# Patient Record
Sex: Female | Born: 1965 | Hispanic: Yes | Smoking: Never smoker
Health system: Southern US, Community
[De-identification: ages and names within clinical notes are randomized; demographics above are authoritative.]

## PROBLEM LIST (undated history)

## (undated) DIAGNOSIS — R7989 Other specified abnormal findings of blood chemistry: Secondary | ICD-10-CM

## (undated) HISTORY — DX: Other specified abnormal findings of blood chemistry: R79.89

---

## 2007-11-01 ENCOUNTER — Ambulatory Visit: Payer: Self-pay | Admitting: Family Medicine

## 2011-03-17 ENCOUNTER — Ambulatory Visit: Payer: Self-pay | Admitting: Internal Medicine

## 2011-04-08 ENCOUNTER — Ambulatory Visit: Payer: Self-pay | Admitting: Family Medicine

## 2013-04-11 ENCOUNTER — Ambulatory Visit: Payer: Self-pay

## 2013-04-27 ENCOUNTER — Ambulatory Visit: Payer: Self-pay

## 2013-05-03 ENCOUNTER — Ambulatory Visit: Payer: Self-pay

## 2013-05-04 LAB — PATHOLOGY REPORT

## 2013-05-07 HISTORY — PX: BREAST BIOPSY: SHX20

## 2014-05-23 ENCOUNTER — Ambulatory Visit
Admit: 2014-05-23 | Disposition: A | Discharge status: Home / Self Care | Payer: Self-pay | Attending: Obstetrics and Gynecology | Admitting: Obstetrics and Gynecology

## 2014-12-18 IMAGING — US US BREAST*L* LIMITED INC AXILLA
1 series · 8 of 8 positions shown · non-contrast
Comparison: [DATE] [DATE], [DATE], [DATE] [DATE], [DATE], [DATE] [DATE], [DATE]

CLINICAL DATA: Callback from screening mammogram for possible mass
left breast

EXAM:
DIGITAL DIAGNOSTIC  LEFT MAMMOGRAM
ULTRASOUND LEFT BREAST

[Series 1: us breast*left* limited inc axilla · 0.07mm/px · 8 of 8 slices shown]
[im 1/8]
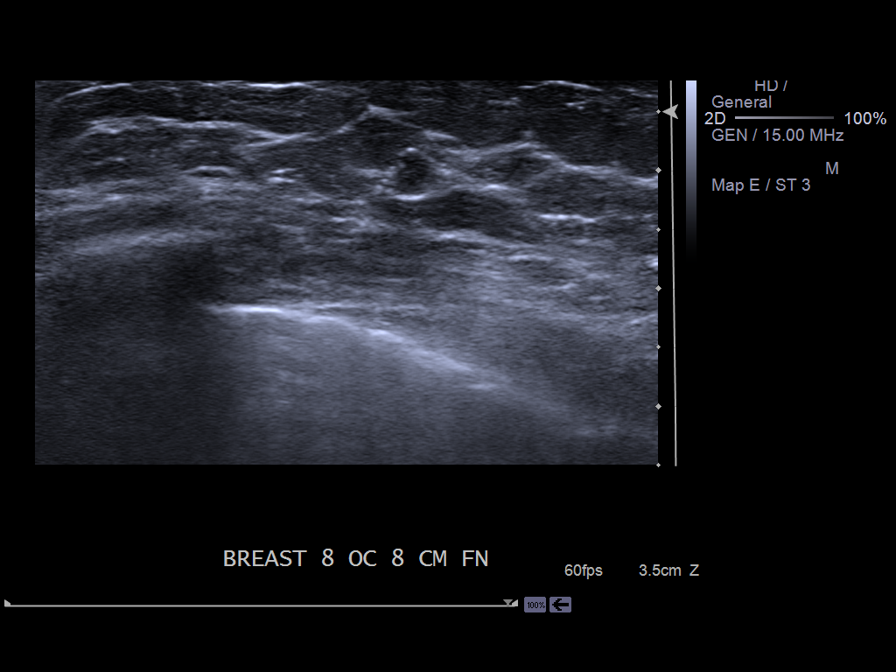
[im 2/8]
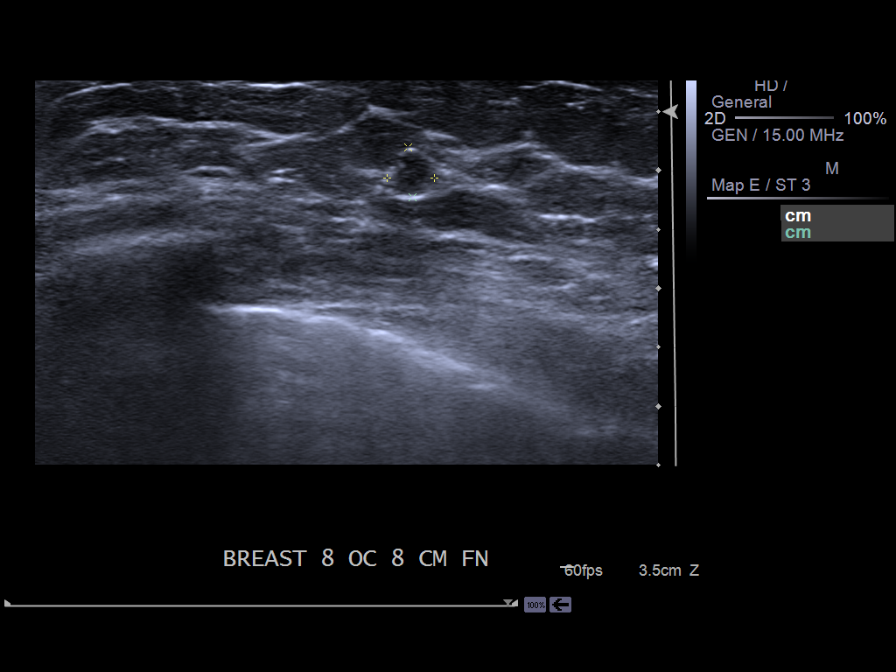
[im 3/8]
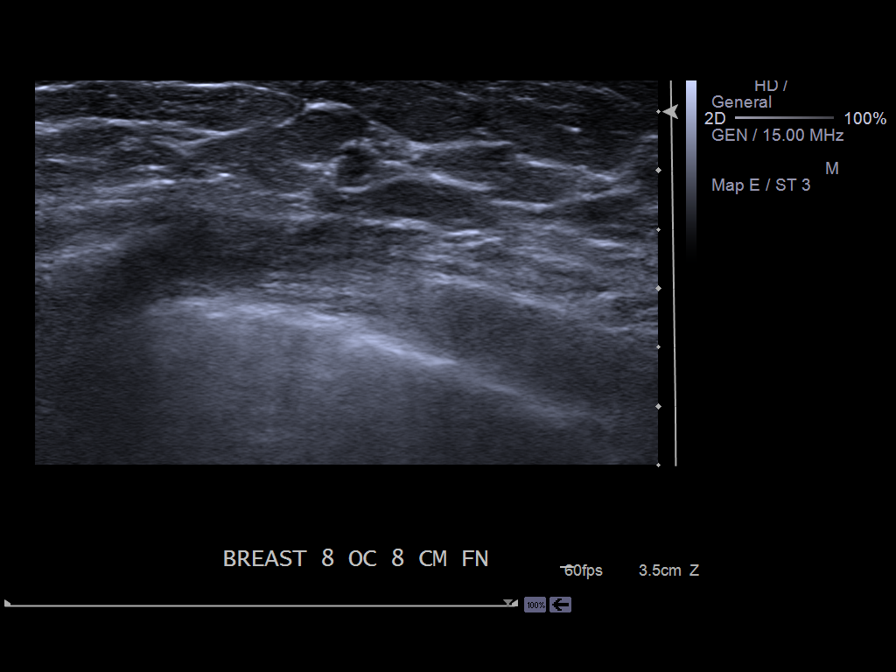
[im 4/8]
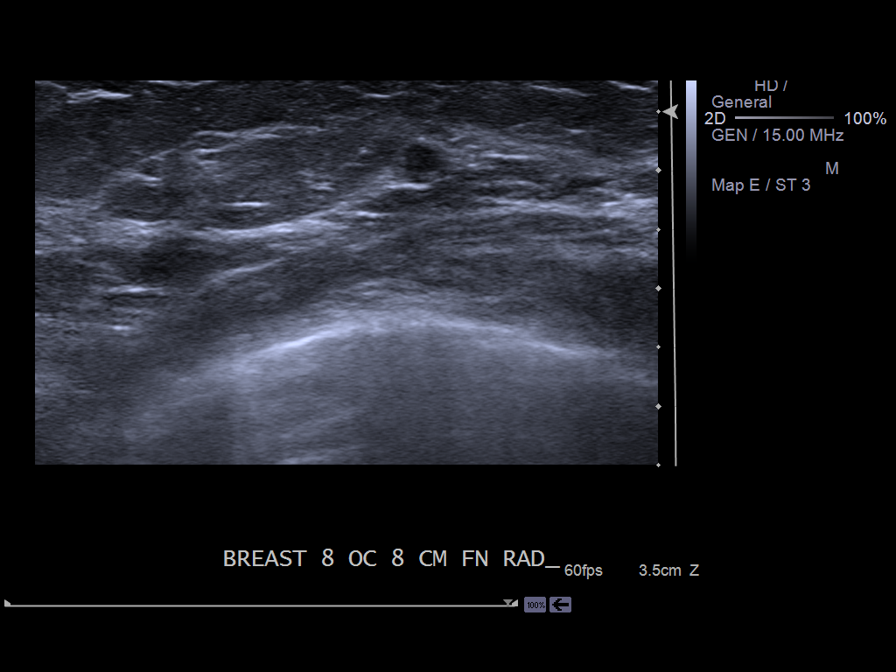
[im 5/8]
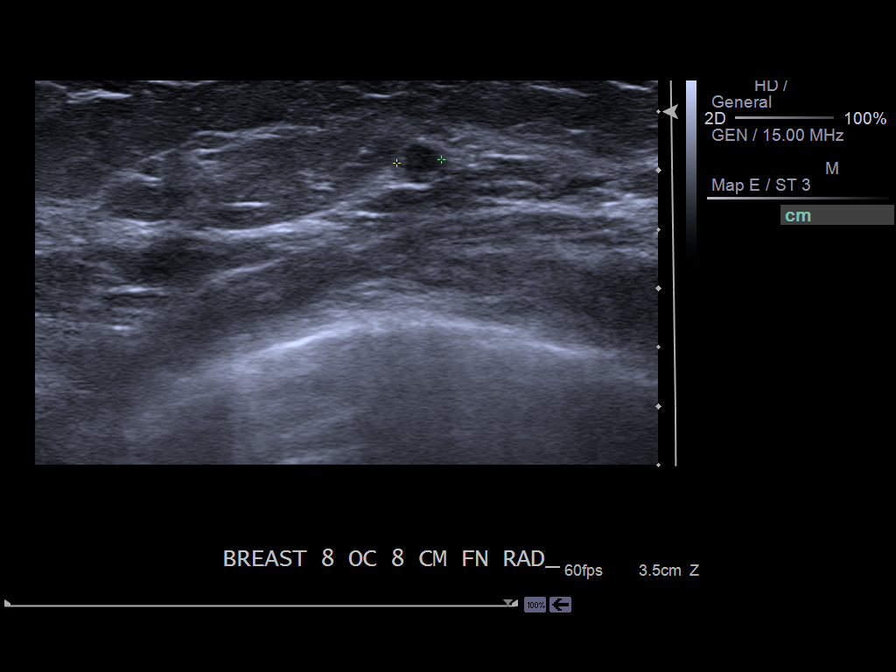
[im 6/8]
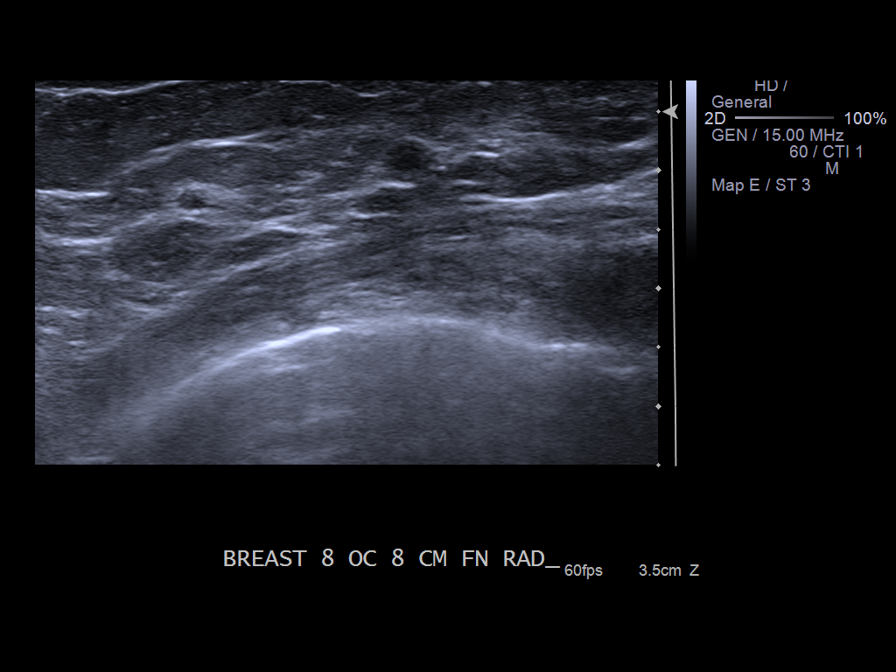
[im 7/8]
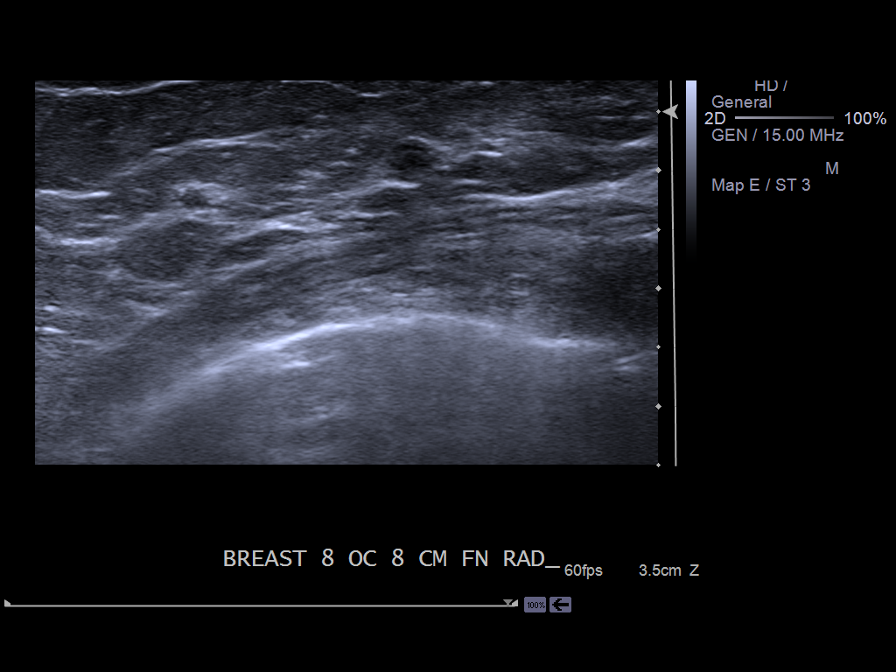
[im 8/8]
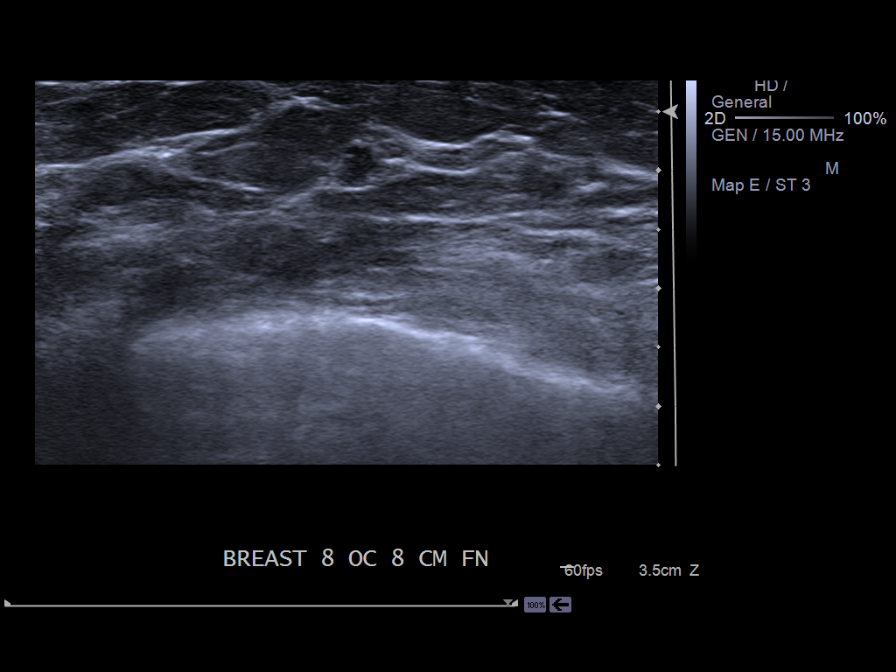

[8 of 8 positions shown; findings below may reference images not displayed]

ACR Breast Density Category c: The breast tissue is heterogeneously
dense, which may obscure small masses.
FINDINGS: Spot compression CC and MLO views of the left breast are submitted.
Previously questioned mass persists on additional views.

Ultrasound is performed, showing slight lobulated border hypoechoic
lesion measuring 0.4 x 0.42 x 0.38 cm at the left breast 8 o'clock 8
cm from nipple correlating to the mammographic finding.
IMPRESSION: Suspicious findings.

RECOMMENDATION:
Ultrasound-guided core biopsy left breast mass.

I have discussed the findings and recommendations with the patient.
Results were also provided in writing at the conclusion of the
visit. If applicable, a reminder letter will be sent to the patient
regarding the next appointment.

BI-RADS CATEGORY  4: Suspicious abnormality - biopsy should be
considered.

## 2014-12-24 IMAGING — MG MM POST US BIOPSY *L*
2 series · 3 of 3 positions shown · non-contrast
Comparison: Previous exams

CLINICAL DATA: Clip placement for left breast mass 8 o'clock
location ultrasound-guided biopsy

EXAM:
DIAGNOSTIC left MAMMOGRAM POST ULTRASOUND BIOPSY

[L CC · left · 2 of 2 slices shown (1 of 2)]
[im 1/2]
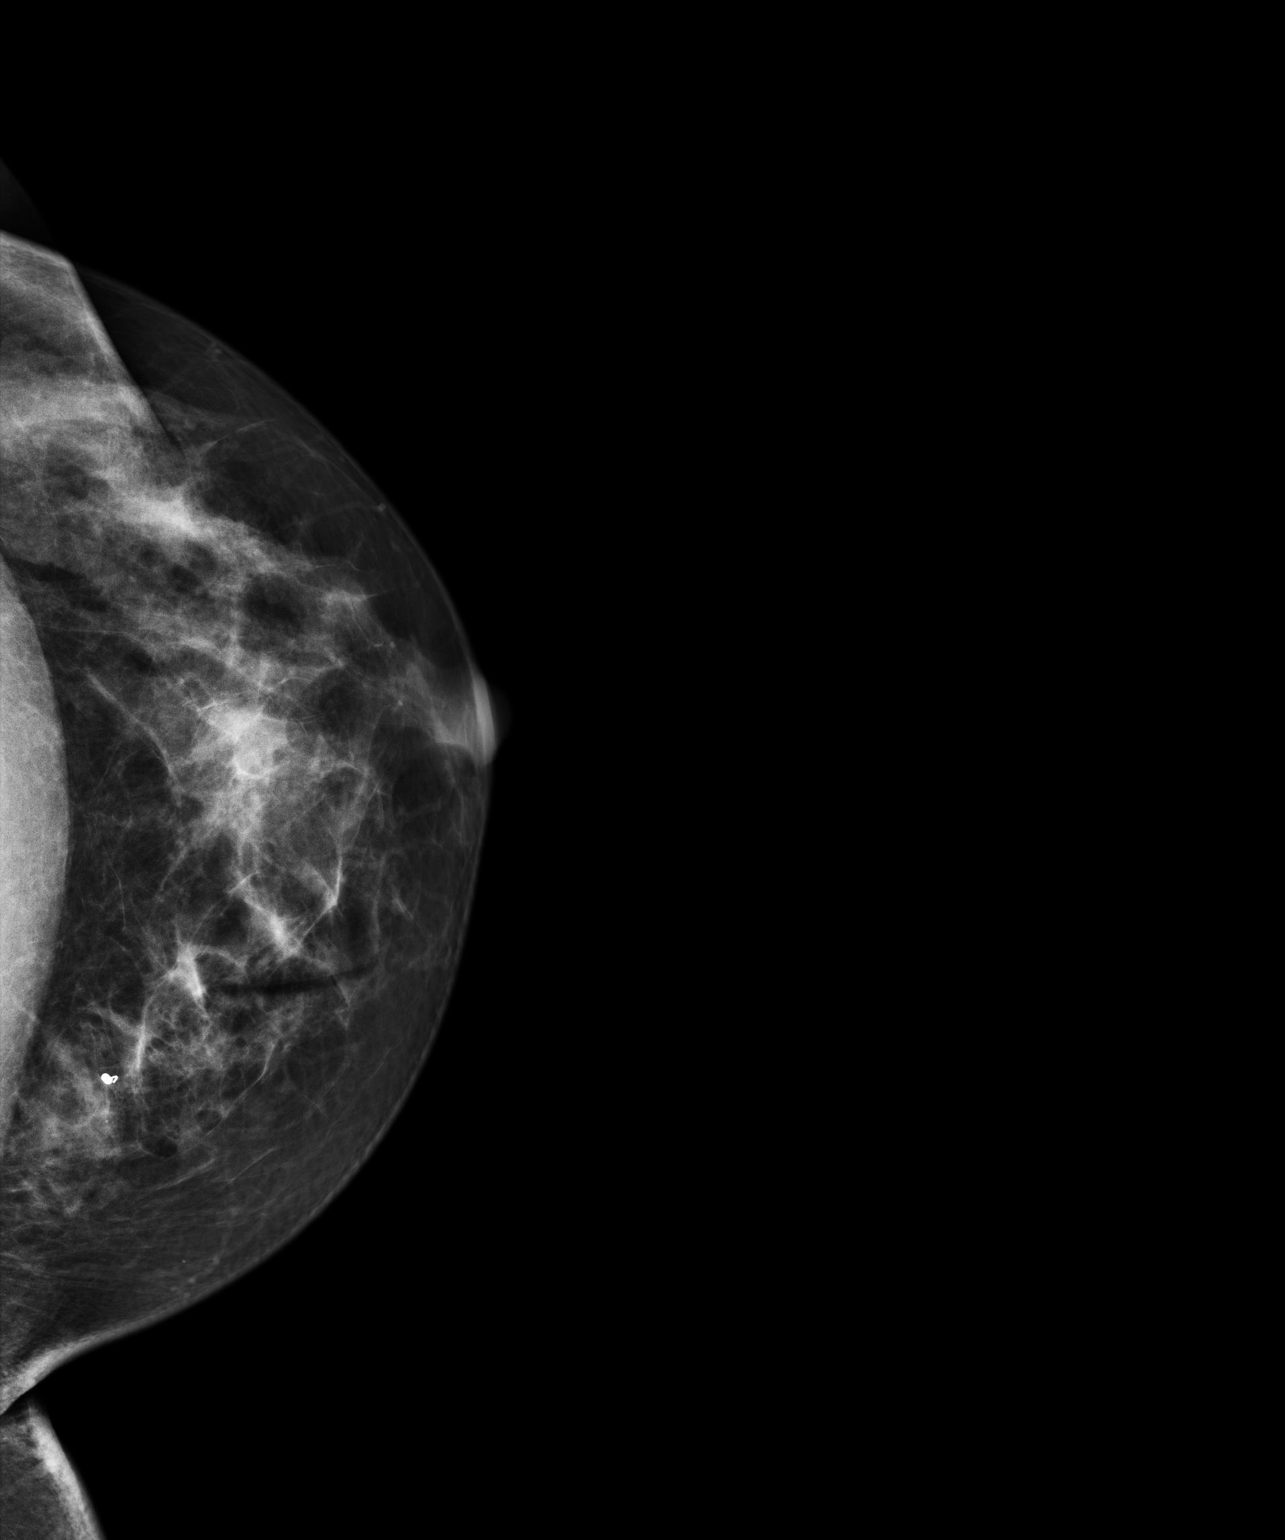
[im 2/2]
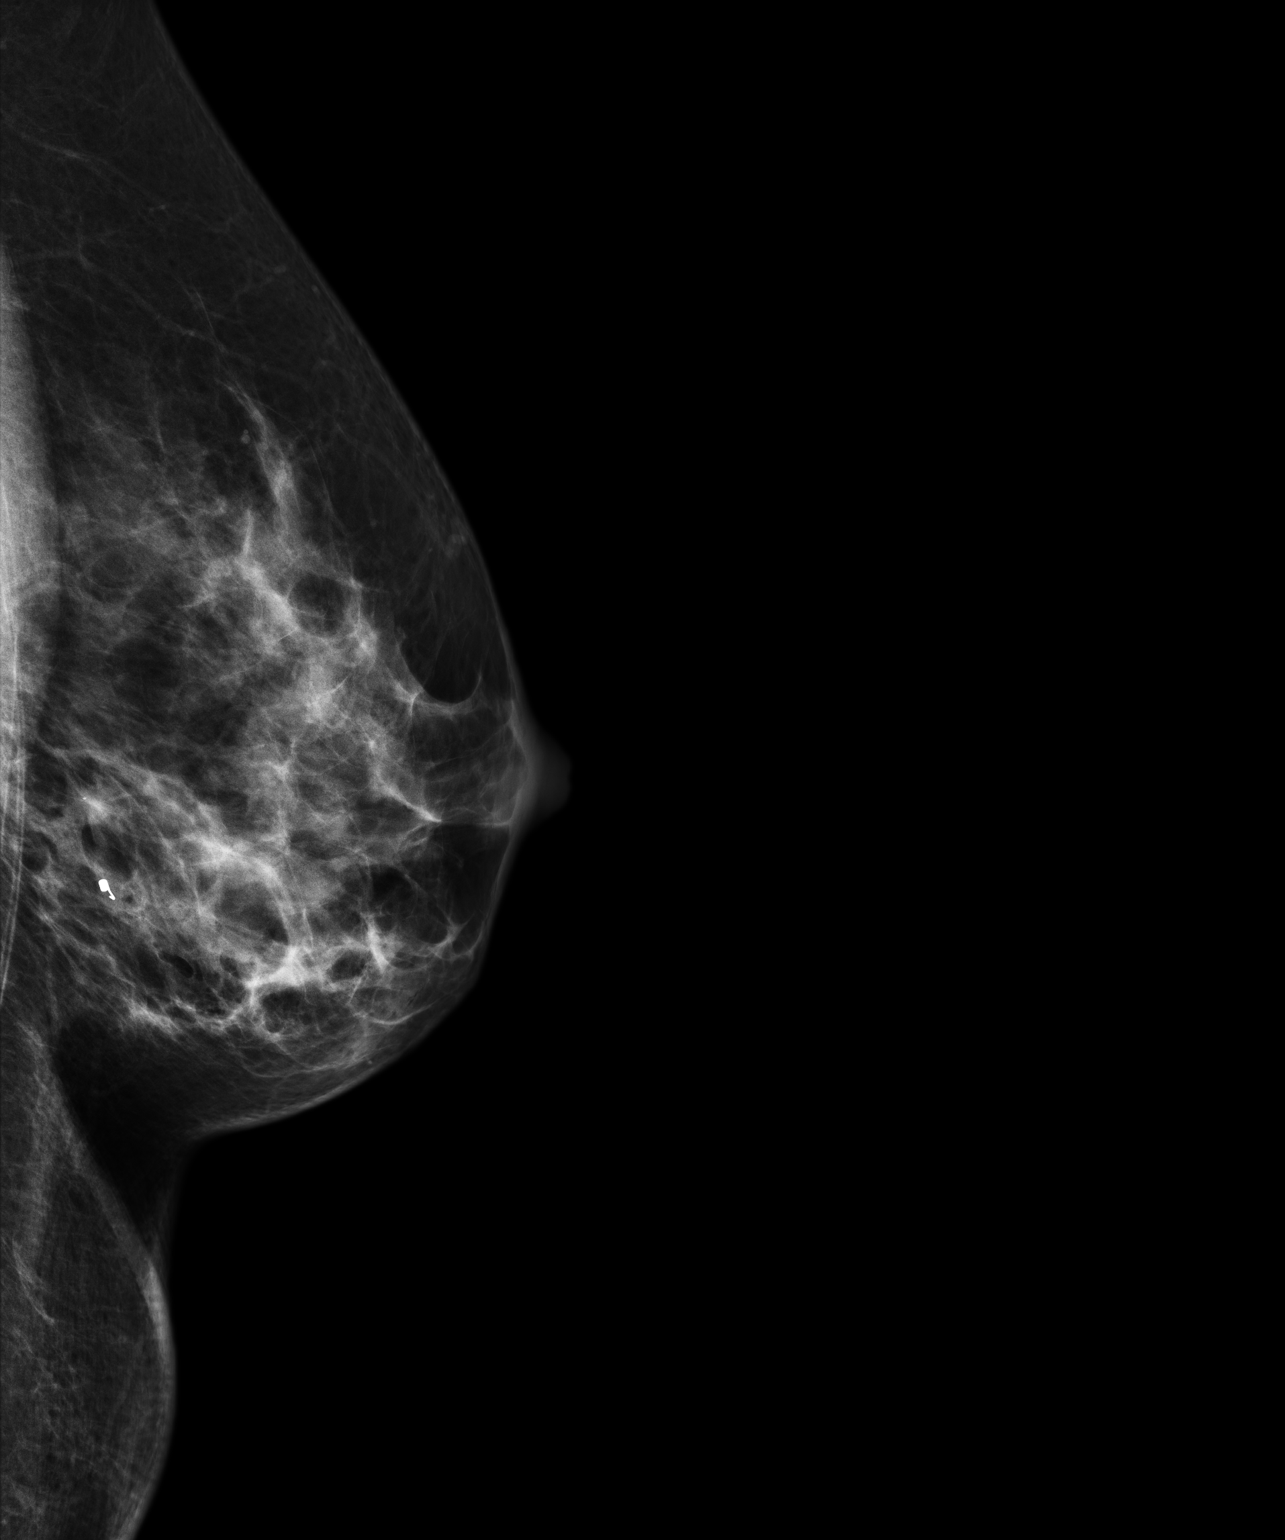

[L CC (2 of 2)]
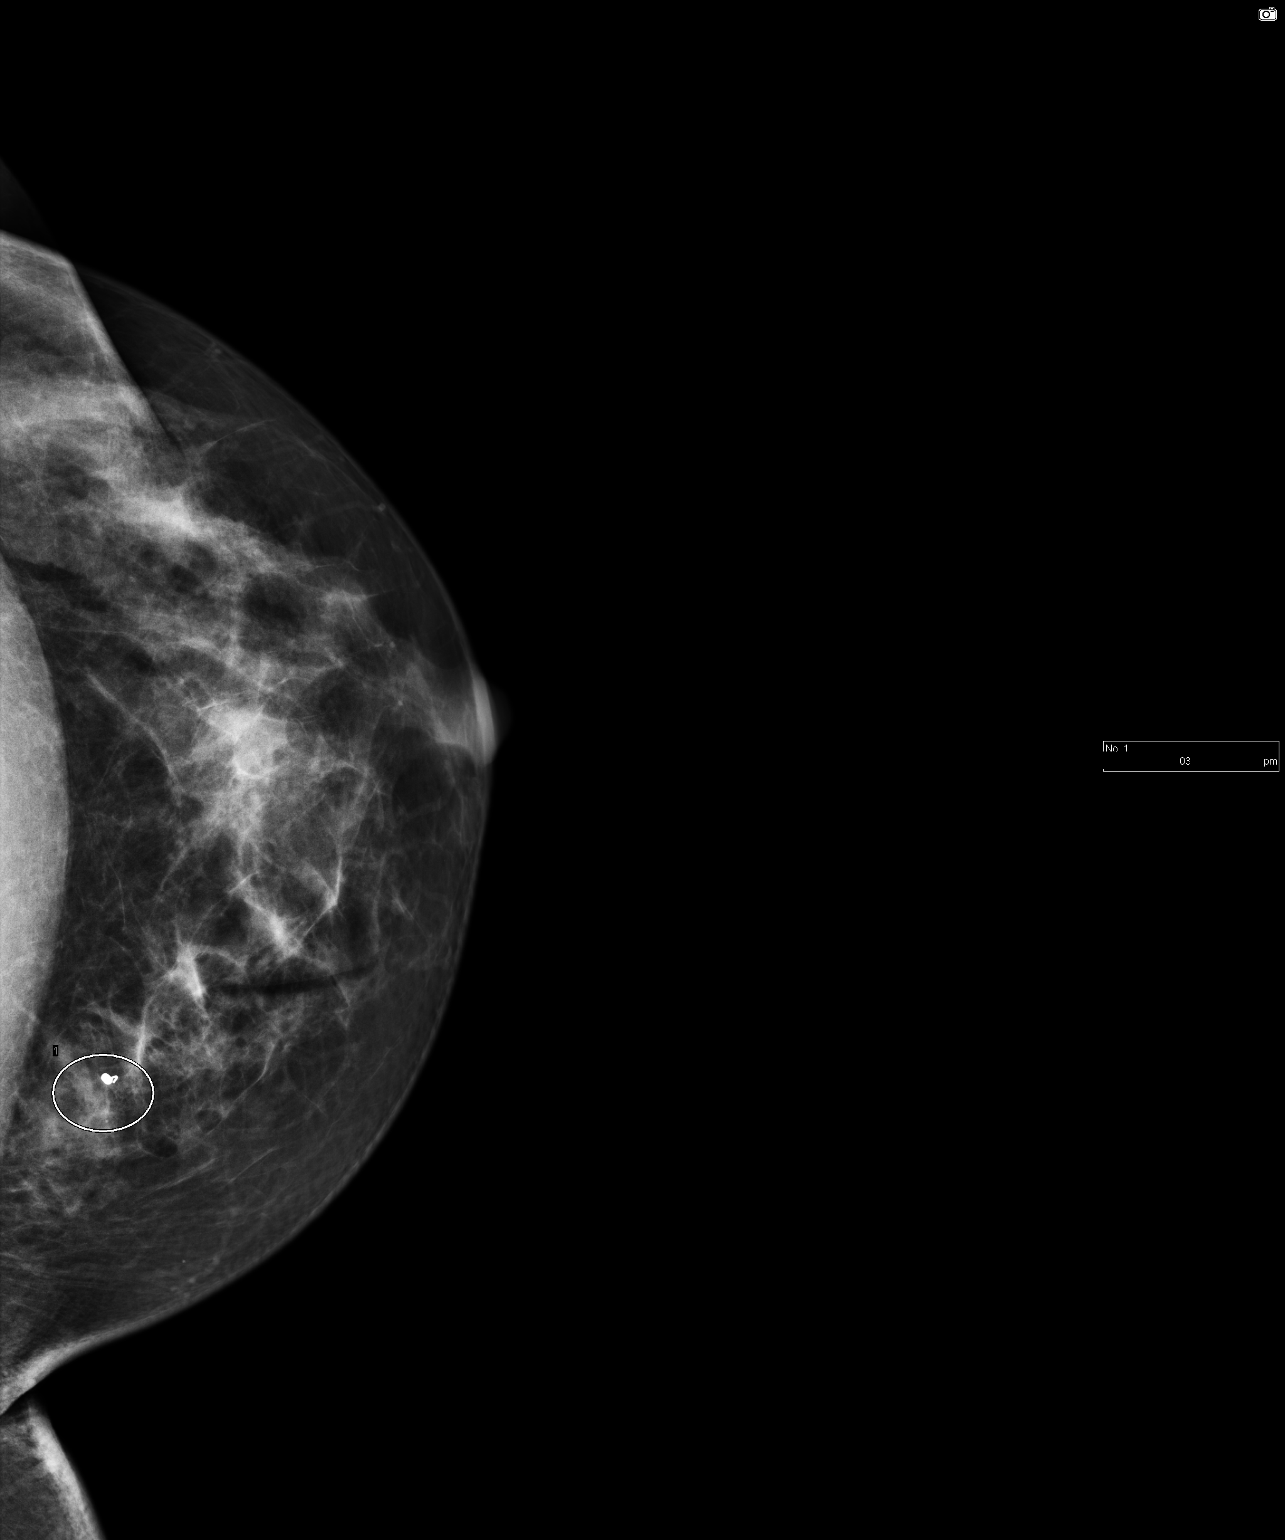

[3 of 3 positions shown; findings below may reference images not displayed]

FINDINGS: Mammographic images were obtained following ultrasound guided biopsy
of left breast mass 8 o'clock location. The coil shaped clip is
appropriately located at the biopsy site..
IMPRESSION: Appropriate coil shaped clip location, left breast 8 o'clock
location.

Final Assessment: Post Procedure Mammograms for Marker Placement

## 2015-04-09 ENCOUNTER — Other Ambulatory Visit: Payer: Self-pay | Admitting: Nurse Practitioner

## 2015-04-09 DIAGNOSIS — Z1231 Encounter for screening mammogram for malignant neoplasm of breast: Secondary | ICD-10-CM

## 2015-05-26 ENCOUNTER — Ambulatory Visit: Payer: Self-pay | Attending: Nurse Practitioner

## 2016-04-13 ENCOUNTER — Other Ambulatory Visit: Payer: Self-pay | Admitting: Nurse Practitioner

## 2016-04-13 DIAGNOSIS — Z1231 Encounter for screening mammogram for malignant neoplasm of breast: Secondary | ICD-10-CM

## 2016-05-10 ENCOUNTER — Encounter: Payer: Self-pay | Admitting: Radiology

## 2016-05-10 ENCOUNTER — Ambulatory Visit
Admission: RE | Admit: 2016-05-10 | Discharge: 2016-05-10 | Disposition: A | Discharge status: Home / Self Care | Payer: Managed Care, Other (non HMO) | Source: Ambulatory Visit | Attending: Nurse Practitioner | Admitting: Nurse Practitioner

## 2016-05-10 DIAGNOSIS — Z1231 Encounter for screening mammogram for malignant neoplasm of breast: Secondary | ICD-10-CM | POA: Diagnosis not present

## 2017-04-15 ENCOUNTER — Other Ambulatory Visit: Payer: Self-pay | Admitting: Nurse Practitioner

## 2017-04-15 ENCOUNTER — Encounter: Payer: Self-pay | Admitting: Nurse Practitioner

## 2017-04-15 ENCOUNTER — Ambulatory Visit: Payer: Managed Care, Other (non HMO) | Admitting: Nurse Practitioner

## 2017-04-15 VITALS — BP 111/72 | HR 64 | Resp 16 | Ht <= 58 in | Wt 131.0 lb

## 2017-04-15 DIAGNOSIS — R300 Dysuria: Secondary | ICD-10-CM

## 2017-04-15 DIAGNOSIS — E559 Vitamin D deficiency, unspecified: Secondary | ICD-10-CM | POA: Diagnosis not present

## 2017-04-15 DIAGNOSIS — Z0001 Encounter for general adult medical examination with abnormal findings: Secondary | ICD-10-CM

## 2017-04-15 DIAGNOSIS — Z1231 Encounter for screening mammogram for malignant neoplasm of breast: Secondary | ICD-10-CM

## 2017-04-15 DIAGNOSIS — Z1239 Encounter for other screening for malignant neoplasm of breast: Secondary | ICD-10-CM

## 2017-04-15 NOTE — Progress Notes (Signed)
[facility] [place], [place] [address]  Internal MEDICINE  Office Visit Note  Patient Name: Kathleen Washington  [id number]  [id number]  Date of Service: 05/07/2017  No chief complaint on file.    The patient is here for health maintenance exam. She has no concerns or complaints to bring out at this time.   Pt is here for routine health maintenance examination  Current Medication: No outpatient encounter medications on file as of 04/15/2017.   No facility-administered encounter medications on file as of 04/15/2017.     Surgical History: Past Surgical History:  Procedure Laterality Date  . BREAST BIOPSY Left 05/07/2013   neg    Medical History: No past medical history on file.  Family History: No family history on file.    Review of Systems  Constitutional: Negative for activity change, chills, fatigue and unexpected weight change.  HENT: Negative for congestion, postnasal drip, rhinorrhea, sneezing, sore throat and voice change.   Eyes: Negative.  Negative for redness.  Respiratory: Negative.  Negative for cough, chest tightness, shortness of breath and wheezing.   Cardiovascular: Negative for chest pain and palpitations.  Gastrointestinal: Negative for abdominal pain, constipation, diarrhea, nausea and vomiting.  Endocrine: Negative for cold intolerance, heat intolerance, polydipsia, polyphagia and polyuria.  Genitourinary: Negative for dysuria, frequency, urgency and vaginal discharge.       She is reporting irregular periods. Has one every few months. Very light with mild cramping.   Musculoskeletal: Negative.  Negative for arthralgias, back pain, joint swelling and neck pain.  Skin: Negative.  Negative for rash.  Allergic/Immunologic: Negative for environmental allergies.  Neurological: Negative for tremors, numbness and headaches.  Hematological: Negative for adenopathy. Does not bruise/bleed easily.  Psychiatric/Behavioral: Negative  for behavioral problems (Depression), sleep disturbance and suicidal ideas. The patient is not nervous/anxious.      Today's Vitals   04/15/17 1519  BP: 111/72  Pulse: 64  Resp: 16  SpO2: 98%  Weight: 131 lb (59.4 kg)  Height: 4\' 9"  (1.448 m)    Physical Exam  Constitutional: She is oriented to person, place, and time. She appears well-developed and well-nourished. No distress.  HENT:  Head: Normocephalic and atraumatic.  Mouth/Throat: Oropharynx is clear and moist. No oropharyngeal exudate.  Eyes: Pupils are equal, round, and reactive to light. EOM are normal.  Neck: Normal range of motion. Neck supple. No JVD present. Carotid bruit is not present. No tracheal deviation present. No thyromegaly present.  Cardiovascular: Normal rate, regular rhythm, normal heart sounds and intact distal pulses. Exam reveals no gallop and no friction rub.  No murmur heard. Pulmonary/Chest: Effort normal and breath sounds normal. No respiratory distress. She has no wheezes. She has no rales. She exhibits no tenderness. Right breast exhibits no inverted nipple, no mass, no nipple discharge, no skin change and no tenderness. Left breast exhibits no inverted nipple, no mass, no nipple discharge, no skin change and no tenderness. Breasts are symmetrical.  Abdominal: Soft. Bowel sounds are normal. There is no tenderness.  Musculoskeletal: Normal range of motion.  Lymphadenopathy:    She has no cervical adenopathy.  Neurological: She is alert and oriented to person, place, and time. No cranial nerve deficit.  Skin: Skin is warm and dry. She is not diaphoretic.  Psychiatric: She has a normal mood and affect. Her behavior is normal. Judgment and thought content normal.  Nursing note and vitals reviewed.    LABS: Recent Results (from the past 2160 hour(s))  UA/M  w/rflx Culture, Routine     Status: None   Collection Time: 04/15/17 12:00 AM  Result Value Ref Range   Specific Gravity, UA 1.025 1.005 - 1.030    pH, UA 6.0 5.0 - 7.5   Color, UA Yellow Yellow   Appearance Ur Clear Clear   Leukocytes, UA Negative Negative   Protein, UA Negative Negative/Trace   Glucose, UA Negative Negative   Ketones, UA Negative Negative   RBC, UA Negative Negative   Bilirubin, UA Negative Negative   Urobilinogen, Ur 0.2 0.2 - 1.0 mg/dL   Nitrite, UA Negative Negative   Microscopic Examination Comment     Comment: Microscopic follows if indicated.   Microscopic Examination See below:     Comment: Microscopic was indicated and was performed.   Urinalysis Reflex Comment     Comment: This specimen will not reflex to a Urine Culture.  Microscopic Examination     Status: None   Collection Time: 04/15/17 12:00 AM  Result Value Ref Range   WBC, UA 0-5 0 - 5 /hpf   RBC, UA 0-2 0 - 2 /hpf   Epithelial Cells (non renal) 0-10 0 - 10 /hpf   Casts None seen None seen /lpf   Mucus, UA Present Not Estab.   Bacteria, UA Few None seen/Few  Comprehensive metabolic panel     Status: Abnormal   Collection Time: 04/21/17  9:36 AM  Result Value Ref Range   Glucose 89 65 - 99 mg/dL   BUN 16 6 - 24 mg/dL   Creatinine, Ser 0.62 0.57 - 1.00 mg/dL   GFR calc non Af Amer 104 >59 mL/min/1.73   GFR calc Af Amer 120 >59 mL/min/1.73   BUN/Creatinine Ratio 26 (H) 9 - 23   Sodium 142 134 - 144 mmol/L   Potassium 4.5 3.5 - 5.2 mmol/L   Chloride 106 96 - 106 mmol/L   CO2 24 20 - 29 mmol/L   Calcium 9.7 8.7 - 10.2 mg/dL   Total Protein 7.0 6.0 - 8.5 g/dL   Albumin 4.4 3.5 - 5.5 g/dL   Globulin, Total 2.6 1.5 - 4.5 g/dL   Albumin/Globulin Ratio 1.7 1.2 - 2.2   Bilirubin Total 0.4 0.0 - 1.2 mg/dL   Alkaline Phosphatase 88 39 - 117 IU/L   AST 32 0 - 40 IU/L   ALT 32 0 - 32 IU/L  CBC     Status: None   Collection Time: 04/21/17  9:36 AM  Result Value Ref Range   WBC 5.2 3.4 - 10.8 x10E3/uL   RBC 4.32 3.77 - 5.28 x10E6/uL   Hemoglobin 13.3 11.1 - 15.9 g/dL   Hematocrit 39.6 34.0 - 46.6 %   MCV 92 79 - 97 fL   MCH 30.8 26.6 -  33.0 pg   MCHC 33.6 31.5 - 35.7 g/dL   RDW 13.2 12.3 - 15.4 %   Platelets 246 150 - 379 x10E3/uL  Lipid Panel w/o Chol/HDL Ratio     Status: Abnormal   Collection Time: 04/21/17  9:36 AM  Result Value Ref Range   Cholesterol, Total 194 100 - 199 mg/dL   Triglycerides 37 0 - 149 mg/dL   HDL 77 >39 mg/dL   VLDL Cholesterol Cal 7 5 - 40 mg/dL   LDL Calculated 110 (H) 0 - 99 mg/dL  T4, free     Status: None   Collection Time: 04/21/17  9:36 AM  Result Value Ref Range   Free T4 1.10 0.82 - 1.77  ng/dL  TSH     Status: None   Collection Time: 04/21/17  9:36 AM  Result Value Ref Range   TSH 1.070 0.450 - 4.500 uIU/mL  VITAMIN D 25 Hydroxy (Vit-D Deficiency, Fractures)     Status: Abnormal   Collection Time: 04/21/17  9:36 AM  Result Value Ref Range   Vit D, 25-Hydroxy 28.0 (L) 30.0 - 100.0 ng/mL    Comment: Vitamin D deficiency has been defined by the [place] and an Endocrine Society practice guideline as a level of serum 25-OH vitamin D less than 20 ng/mL (1,2). The Endocrine Society went on to further define vitamin D insufficiency as a level between 21 and 29 ng/mL (2). 1. IOM (Institute of Medicine). 2010. Dietary reference    intakes for calcium and D. [place]: The    [employer]. 2. Holick MF, Binkley Bel[place], Bischoff-Ferrari HA, et al.    Evaluation, treatment, and prevention of vitamin D    deficiency: an Endocrine Society clinical practice    guideline. JCEM. 2011 Jul; 96(7):1911-30.      Assessment/Plan: 1. Encounter for general adult medical examination with abnormal findings Annual wellness visit today - CBC with Differential/Platelet - Comprehensive metabolic panel - T4, free - TSH - Lipid panel  2. Vitamin D deficiency Check labs with vitamin d level - Vitamin D 1,25 dihydroxy  3. Dysuria - Urinalysis, Routine w reflex microscopic  4. Screening for breast cancer - MM DIGITAL SCREENING BILATERAL; Future  General Counseling:  Jax verbalizes understanding of the findings of todays visit and agrees with plan of treatment. I have discussed any further diagnostic evaluation that may be needed or ordered today. We also reviewed her medications today. she has been encouraged to call the office with any questions or concerns that should arise related to todays visit.  This patient was seen by Leretha Pol, FNP- C in Collaboration with Dr Lavera Guise as a part of collaborative care agreement    Orders Placed This Encounter  Procedures  . MM DIGITAL SCREENING BILATERAL  . Urinalysis, Routine w reflex microscopic  . CBC with Differential/Platelet  . Comprehensive metabolic panel  . T4, free  . TSH  . Lipid panel  . Vitamin D 1,25 dihydroxy      Time spent: [place], MD  Internal Medicine

## 2017-04-16 LAB — UA/M W/RFLX CULTURE, ROUTINE
BILIRUBIN UA: NEGATIVE
Glucose, UA: NEGATIVE
Ketones, UA: NEGATIVE
LEUKOCYTES UA: NEGATIVE
Nitrite, UA: NEGATIVE
PH UA: 6 (ref 5.0–7.5)
PROTEIN UA: NEGATIVE
RBC, UA: NEGATIVE
Specific Gravity, UA: 1.025 (ref 1.005–1.030)
UUROB: 0.2 mg/dL (ref 0.2–1.0)

## 2017-04-16 LAB — MICROSCOPIC EXAMINATION: Casts: NONE SEEN /lpf

## 2017-04-21 ENCOUNTER — Other Ambulatory Visit: Payer: Self-pay | Admitting: Nurse Practitioner

## 2017-04-22 LAB — VITAMIN D 25 HYDROXY (VIT D DEFICIENCY, FRACTURES): Vit D, 25-Hydroxy: 28 ng/mL — ABNORMAL LOW (ref 30.0–100.0)

## 2017-04-22 LAB — LIPID PANEL W/O CHOL/HDL RATIO
Cholesterol, Total: 194 mg/dL (ref 100–199)
HDL: 77 mg/dL (ref >39)
LDL Calculated: 110 mg/dL — ABNORMAL HIGH (ref 0–99)
TRIGLYCERIDES: 37 mg/dL (ref 0–149)
VLDL Cholesterol Cal: 7 mg/dL (ref 5–40)

## 2017-04-22 LAB — T4, FREE: Free T4: 1.1 ng/dL (ref 0.82–1.77)

## 2017-04-22 LAB — CBC
HEMATOCRIT: 39.6 % (ref 34.0–46.6)
Hemoglobin: 13.3 g/dL (ref 11.1–15.9)
MCH: 30.8 pg (ref 26.6–33.0)
MCHC: 33.6 g/dL (ref 31.5–35.7)
MCV: 92 fL (ref 79–97)
PLATELETS: 246 10*3/uL (ref 150–379)
RBC: 4.32 x10E6/uL (ref 3.77–5.28)
RDW: 13.2 % (ref 12.3–15.4)
WBC: 5.2 10*3/uL (ref 3.4–10.8)

## 2017-04-22 LAB — COMPREHENSIVE METABOLIC PANEL
ALBUMIN: 4.4 g/dL (ref 3.5–5.5)
ALK PHOS: 88 IU/L (ref 39–117)
ALT: 32 IU/L (ref 0–32)
AST: 32 IU/L (ref 0–40)
Albumin/Globulin Ratio: 1.7 (ref 1.2–2.2)
BUN / CREAT RATIO: 26 — AB (ref 9–23)
BUN: 16 mg/dL (ref 6–24)
Bilirubin Total: 0.4 mg/dL (ref 0.0–1.2)
CHLORIDE: 106 mmol/L (ref 96–106)
CO2: 24 mmol/L (ref 20–29)
Calcium: 9.7 mg/dL (ref 8.7–10.2)
Creatinine, Ser: 0.62 mg/dL (ref 0.57–1.00)
GFR calc Af Amer: 120 mL/min/{1.73_m2} (ref >59)
GFR calc non Af Amer: 104 mL/min/{1.73_m2} (ref >59)
GLUCOSE: 89 mg/dL (ref 65–99)
Globulin, Total: 2.6 g/dL (ref 1.5–4.5)
Potassium: 4.5 mmol/L (ref 3.5–5.2)
Sodium: 142 mmol/L (ref 134–144)
Total Protein: 7 g/dL (ref 6.0–8.5)

## 2017-04-22 LAB — TSH: TSH: 1.07 u[IU]/mL (ref 0.450–4.500)

## 2017-04-25 ENCOUNTER — Telehealth: Payer: Self-pay

## 2017-04-25 NOTE — Telephone Encounter (Signed)
Completed [facility] request on Dx codes and pt demographics and faxed back.  dbs

## 2017-05-07 DIAGNOSIS — R300 Dysuria: Secondary | ICD-10-CM | POA: Insufficient documentation

## 2017-05-07 DIAGNOSIS — Z1211 Encounter for screening for malignant neoplasm of colon: Secondary | ICD-10-CM | POA: Insufficient documentation

## 2017-05-07 DIAGNOSIS — E559 Vitamin D deficiency, unspecified: Secondary | ICD-10-CM | POA: Insufficient documentation

## 2017-09-05 ENCOUNTER — Ambulatory Visit
Admission: RE | Admit: 2017-09-05 | Discharge: 2017-09-05 | Disposition: A | Discharge status: Home / Self Care | Payer: Managed Care, Other (non HMO) | Source: Ambulatory Visit | Attending: Nurse Practitioner | Admitting: Nurse Practitioner

## 2017-09-05 DIAGNOSIS — Z1239 Encounter for other screening for malignant neoplasm of breast: Secondary | ICD-10-CM

## 2017-09-05 DIAGNOSIS — Z1231 Encounter for screening mammogram for malignant neoplasm of breast: Secondary | ICD-10-CM | POA: Diagnosis present

## 2018-04-17 ENCOUNTER — Ambulatory Visit (INDEPENDENT_AMBULATORY_CARE_PROVIDER_SITE_OTHER): Payer: Managed Care, Other (non HMO) | Admitting: Nurse Practitioner

## 2018-04-17 ENCOUNTER — Encounter: Payer: Self-pay | Admitting: Nurse Practitioner

## 2018-04-17 VITALS — BP 120/80 | HR 75 | Resp 16 | Ht <= 58 in | Wt 134.0 lb

## 2018-04-17 DIAGNOSIS — Z1211 Encounter for screening for malignant neoplasm of colon: Secondary | ICD-10-CM | POA: Diagnosis not present

## 2018-04-17 DIAGNOSIS — Z1239 Encounter for other screening for malignant neoplasm of breast: Secondary | ICD-10-CM | POA: Diagnosis not present

## 2018-04-17 DIAGNOSIS — Z0001 Encounter for general adult medical examination with abnormal findings: Secondary | ICD-10-CM

## 2018-04-17 DIAGNOSIS — R300 Dysuria: Secondary | ICD-10-CM

## 2018-04-17 NOTE — Progress Notes (Signed)
[facility] [place],  [address]  Internal MEDICINE  Office Visit Note  Patient Name: Kathleen Washington  [id number]  [id number]  Date of Service: 04/30/2018   Pt is here for routine health maintenance examination  Chief Complaint  Patient presents with  . Annual Exam  . Quality Metric Gaps    colonoscopy     The patient is here for health maintenance exam. She has no concerns or complaints to bring out at this time. She is due to have routine, fasting labs, mammogram, and screening colonoscopy.     Current Medication: No outpatient encounter medications on file as of 04/17/2018.   No facility-administered encounter medications on file as of 04/17/2018.     Surgical History: Past Surgical History:  Procedure Laterality Date  . BREAST BIOPSY Left 05/07/2013   neg    Medical History: History reviewed. No pertinent past medical history.  Family History: History reviewed. No pertinent family history.    Review of Systems  Constitutional: Negative for activity change, chills, fatigue and unexpected weight change.  HENT: Negative for congestion, postnasal drip, rhinorrhea, sneezing, sore throat and voice change.   Respiratory: Negative for cough, chest tightness, shortness of breath and wheezing.   Cardiovascular: Negative for chest pain and palpitations.  Gastrointestinal: Negative for abdominal pain, constipation, diarrhea, nausea and vomiting.  Endocrine: Negative for cold intolerance, heat intolerance, polydipsia and polyuria.  Genitourinary: Negative for dysuria, frequency, menstrual problem, urgency and vaginal discharge.       She is reporting irregular periods. Has one every few months. Very light with mild cramping.   Musculoskeletal: Negative for arthralgias, back pain, joint swelling and neck pain.  Skin: Negative for rash.  Allergic/Immunologic: Negative for environmental allergies.  Neurological: Negative for tremors,  numbness and headaches.  Hematological: Negative for adenopathy. Does not bruise/bleed easily.  Psychiatric/Behavioral: Negative for behavioral problems (Depression), sleep disturbance and suicidal ideas. The patient is not nervous/anxious.      Today's Vitals   04/17/18 1431  BP: 120/80  Pulse: 75  Resp: 16  SpO2: 98%  Weight: 134 lb (60.8 kg)  Height: 4\' 9"  (1.448 m)   Body mass index is 29 kg/m.  Physical Exam Vitals signs and nursing note reviewed.  Constitutional:      General: She is not in acute distress.    Appearance: Normal appearance. She is well-developed. She is not diaphoretic.  HENT:     Head: Normocephalic and atraumatic.     Mouth/Throat:     Pharynx: No oropharyngeal exudate.  Eyes:     Conjunctiva/sclera: Conjunctivae normal.     Pupils: Pupils are equal, round, and reactive to light.  Neck:     Musculoskeletal: Normal range of motion and neck supple.     Thyroid: No thyromegaly.     Vascular: No carotid bruit or JVD.     Trachea: No tracheal deviation.  Cardiovascular:     Rate and Rhythm: Normal rate and regular rhythm.     Pulses: Normal pulses.     Heart sounds: Normal heart sounds. No murmur. No friction rub. No gallop.   Pulmonary:     Effort: Pulmonary effort is normal. No respiratory distress.     Breath sounds: Normal breath sounds. No wheezing or rales.  Chest:     Chest wall: No tenderness.     Breasts: Breasts are symmetrical.        Right: Normal. No swelling, bleeding, inverted nipple, mass, nipple discharge, skin change  or tenderness.        Left: Normal. No swelling, bleeding, inverted nipple, mass, nipple discharge, skin change or tenderness.  Abdominal:     General: Bowel sounds are normal.     Palpations: Abdomen is soft.     Tenderness: There is no abdominal tenderness.  Musculoskeletal: Normal range of motion.  Lymphadenopathy:     Cervical: No cervical adenopathy.  Skin:    General: Skin is warm and dry.  Neurological:      Mental Status: She is alert and oriented to person, place, and time.     Cranial Nerves: No cranial nerve deficit.  Psychiatric:        Behavior: Behavior normal.        Thought Content: Thought content normal.        Judgment: Judgment normal.      LABS: Recent Results (from the past 2160 hour(s))  UA/M w/rflx Culture, Routine     Status: Abnormal   Collection Time: 04/17/18  2:00 PM  Result Value Ref Range   Specific Gravity, UA      <=1.005 (A) 1.005 - 1.030   pH, UA 7.0 5.0 - 7.5   Color, UA Yellow Yellow   Appearance Ur Clear Clear   Leukocytes, UA Negative Negative   Protein, UA Negative Negative/Trace   Glucose, UA Negative Negative   Ketones, UA Negative Negative   RBC, UA Negative Negative   Bilirubin, UA Negative Negative   Urobilinogen, Ur 0.2 0.2 - 1.0 mg/dL   Nitrite, UA Negative Negative   Microscopic Examination Comment     Comment: Microscopic follows if indicated.   Microscopic Examination See below:     Comment: Microscopic was indicated and was performed.   Urinalysis Reflex Comment     Comment: This specimen will not reflex to a Urine Culture.  Microscopic Examination     Status: None   Collection Time: 04/17/18  2:00 PM  Result Value Ref Range   WBC, UA 0-5 0 - 5 /hpf   RBC, UA None seen 0 - 2 /hpf   Epithelial Cells (non renal) 0-10 0 - 10 /hpf   Casts None seen None seen /lpf   Mucus, UA Present Not Estab.   Bacteria, UA Few None seen/Few   Assessment/Plan:  1. Encounter for general adult medical examination with abnormal findings Annual health maintenance exam today. Routine, fasting labs ordered today.   2. Screening for colon cancer Refer to GI for screening colonoscopy.  - Ambulatory referral to Gastroenterology  3. Screening for breast cancer - MM DIGITAL SCREENING BILATERAL; Future  4. Dysuria - UA/M w/rflx Culture, Routine  General Counseling: Jakaria verbalizes understanding of the findings of todays visit and agrees with  plan of treatment. I have discussed any further diagnostic evaluation that may be needed or ordered today. We also reviewed her medications today. she has been encouraged to call the office with any questions or concerns that should arise related to todays visit.    Counseling:  This patient was seen by Leretha Pol FNP Collaboration with Dr Lavera Guise as a part of collaborative care agreement  Orders Placed This Encounter  Procedures  . Microscopic Examination  . MM DIGITAL SCREENING BILATERAL  . UA/M w/rflx Culture, Routine  . Ambulatory referral to Gastroenterology    Time spent: [place], MD  Internal Medicine

## 2018-04-18 LAB — UA/M W/RFLX CULTURE, ROUTINE
BILIRUBIN UA: NEGATIVE
GLUCOSE, UA: NEGATIVE
KETONES UA: NEGATIVE
Leukocytes, UA: NEGATIVE
Nitrite, UA: NEGATIVE
Protein, UA: NEGATIVE
RBC, UA: NEGATIVE
Specific Gravity, UA: <=1.005 — AB (ref 1.005–1.030)
Urobilinogen, Ur: 0.2 mg/dL (ref 0.2–1.0)
pH, UA: 7 (ref 5.0–7.5)

## 2018-04-18 LAB — MICROSCOPIC EXAMINATION
Casts: NONE SEEN /lpf
RBC MICROSCOPIC, UA: NONE SEEN /HPF (ref 0–2)

## 2018-05-08 ENCOUNTER — Encounter: Payer: Self-pay | Admitting: *Deleted

## 2018-10-24 ENCOUNTER — Other Ambulatory Visit: Payer: Self-pay

## 2018-10-24 ENCOUNTER — Telehealth: Payer: Self-pay

## 2018-10-24 DIAGNOSIS — Z1211 Encounter for screening for malignant neoplasm of colon: Secondary | ICD-10-CM

## 2018-10-24 MED ORDER — NA SULFATE-K SULFATE-MG SULF 17.5-3.13-1.6 GM/177ML PO SOLN: 1.0000 | Freq: Once | ORAL | 0 refills | Status: AC

## 2018-10-24 NOTE — Telephone Encounter (Signed)
Gastroenterology Pre-Procedure Review  Request Date: 11/10/18 Requesting Physician: Dr. Marius Ditch  PATIENT REVIEW QUESTIONS: The patient responded to the following health history questions as indicated:    1. Are you having any GI issues? no 2. Do you have a personal history of Polyps? no 3. Do you have a family history of Colon Cancer or Polyps? no 4. Diabetes Mellitus? no 5. Joint replacements in the past 12 months?no 6. Major health problems in the past 3 months?no 7. Any artificial heart valves, MVP, or defibrillator?no    MEDICATIONS & ALLERGIES:    Patient reports the following regarding taking any anticoagulation/antiplatelet therapy:   Plavix, Coumadin, Eliquis, Xarelto, Lovenox, Pradaxa, Brilinta, or Effient? no Aspirin? no  Patient confirms/reports the following medications:  No current outpatient medications on file.   No current facility-administered medications for this visit.     Patient confirms/reports the following allergies:  No Known Allergies  No orders of the defined types were placed in this encounter.   AUTHORIZATION INFORMATION Primary Insurance: 1D#: Group #:  Secondary Insurance: 1D#: Group #:  SCHEDULE INFORMATION: Date: 11/10/18 Time: Location:[facility]

## 2018-10-30 ENCOUNTER — Telehealth: Payer: Self-pay | Admitting: Gastroenterology

## 2018-10-30 NOTE — Telephone Encounter (Signed)
Pt daughter is calling to cancel pt procedure due to having a death in the family and leaving the country she will call us back to r/s

## 2018-11-10 ENCOUNTER — Encounter: Admission: RE | Payer: Self-pay | Source: Home / Self Care

## 2018-11-10 ENCOUNTER — Ambulatory Visit: Admission: RE | Admit: 2018-11-10 | Payer: 59 | Source: Home / Self Care | Admitting: Gastroenterology

## 2018-11-10 SURGERY — COLONOSCOPY WITH PROPOFOL
Anesthesia: General

## 2018-11-27 ENCOUNTER — Ambulatory Visit
Admission: RE | Admit: 2018-11-27 | Discharge: 2018-11-27 | Disposition: A | Discharge status: Home / Self Care | Payer: 59 | Source: Ambulatory Visit | Attending: Nurse Practitioner | Admitting: Nurse Practitioner

## 2018-11-27 DIAGNOSIS — Z1231 Encounter for screening mammogram for malignant neoplasm of breast: Secondary | ICD-10-CM | POA: Diagnosis not present

## 2018-11-27 DIAGNOSIS — Z1239 Encounter for other screening for malignant neoplasm of breast: Secondary | ICD-10-CM

## 2018-11-28 NOTE — Progress Notes (Signed)
Negative mammogram

## 2018-12-04 ENCOUNTER — Other Ambulatory Visit: Payer: Self-pay

## 2018-12-04 ENCOUNTER — Telehealth: Payer: Self-pay | Admitting: Gastroenterology

## 2018-12-04 DIAGNOSIS — Z1211 Encounter for screening for malignant neoplasm of colon: Secondary | ICD-10-CM

## 2018-12-04 NOTE — Telephone Encounter (Signed)
Please call Rosa to schedule patients appointment for colonoscopy.

## 2018-12-04 NOTE — Telephone Encounter (Signed)
Colonoscopy has been scheduled for Monday 12/11/18 with Dr. Vicente Males.  Informed Lequita in Endo that patient will need an interpreter.  Her insurance card will be updated when her daughter comes by the office today to pick up colonoscopy instructions.  Thanks [employer]

## 2018-12-07 ENCOUNTER — Other Ambulatory Visit
Admission: RE | Admit: 2018-12-07 | Discharge: 2018-12-07 | Disposition: A | Discharge status: Home / Self Care | Payer: 59 | Source: Ambulatory Visit | Attending: Gastroenterology | Admitting: Gastroenterology

## 2018-12-07 DIAGNOSIS — Z01812 Encounter for preprocedural laboratory examination: Secondary | ICD-10-CM | POA: Diagnosis not present

## 2018-12-07 DIAGNOSIS — Z20828 Contact with and (suspected) exposure to other viral communicable diseases: Secondary | ICD-10-CM | POA: Insufficient documentation

## 2018-12-07 LAB — SARS CORONAVIRUS 2 (TAT 6-24 HRS): SARS Coronavirus 2: NEGATIVE

## 2018-12-11 ENCOUNTER — Encounter
Admission: RE | Disposition: A | Discharge status: Home / Self Care | Payer: Self-pay | Source: Home / Self Care | Attending: Gastroenterology

## 2018-12-11 ENCOUNTER — Ambulatory Visit: Payer: 59 | Admitting: Certified Registered Nurse Anesthetist

## 2018-12-11 ENCOUNTER — Ambulatory Visit
Admission: RE | Admit: 2018-12-11 | Discharge: 2018-12-11 | Disposition: A | Discharge status: Home / Self Care | Payer: 59 | Attending: Gastroenterology | Admitting: Gastroenterology

## 2018-12-11 DIAGNOSIS — Z1211 Encounter for screening for malignant neoplasm of colon: Secondary | ICD-10-CM | POA: Insufficient documentation

## 2018-12-11 DIAGNOSIS — D122 Benign neoplasm of ascending colon: Secondary | ICD-10-CM | POA: Diagnosis not present

## 2018-12-11 DIAGNOSIS — K635 Polyp of colon: Secondary | ICD-10-CM

## 2018-12-11 HISTORY — PX: COLONOSCOPY WITH PROPOFOL: SHX5780

## 2018-12-11 LAB — POCT PREGNANCY, URINE: Preg Test, Ur: NEGATIVE

## 2018-12-11 SURGERY — COLONOSCOPY WITH PROPOFOL
Anesthesia: General

## 2018-12-11 MED ORDER — PROPOFOL 500 MG/50ML IV EMUL
INTRAVENOUS | Status: DC | PRN
  Administered 2018-12-11: 160 ug/kg/min via INTRAVENOUS

## 2018-12-11 MED ORDER — SODIUM CHLORIDE 0.9 % IV SOLN
INTRAVENOUS | Status: DC
  Administered 2018-12-11: 09:00:00 1000 mL via INTRAVENOUS

## 2018-12-11 MED ORDER — PROPOFOL 10 MG/ML IV BOLUS
INTRAVENOUS | Status: DC | PRN
  Administered 2018-12-11: 60 mg via INTRAVENOUS

## 2018-12-11 NOTE — Anesthesia Procedure Notes (Signed)
Performed by: Sharlize Hoar, CRNA Pre-anesthesia Checklist: Patient identified, Emergency Drugs available, Patient being monitored, Suction available and Timeout performed Patient Re-evaluated:Patient Re-evaluated prior to induction Oxygen Delivery Method: Nasal cannula Induction Type: IV induction       

## 2018-12-11 NOTE — Anesthesia Post-op Follow-up Note (Signed)
Anesthesia QCDR form completed.        

## 2018-12-11 NOTE — Anesthesia Preprocedure Evaluation (Addendum)
Anesthesia Evaluation  Patient identified by MRN, date of birth, ID band Patient awake    Reviewed: Allergy & Precautions, H&P , NPO status , Patient's Chart, lab work & pertinent test results  Airway Mallampati: II  TM Distance: >3 FB Neck ROM: full    Dental  (+) Upper Dentures, Lower Dentures   Pulmonary neg pulmonary ROS,           Cardiovascular negative cardio ROS       Neuro/Psych negative neurological ROS  negative psych ROS   GI/Hepatic negative GI ROS, Neg liver ROS,   Endo/Other  negative endocrine ROS  Renal/GU negative Renal ROS  negative genitourinary   Musculoskeletal   Abdominal   Peds  Hematology negative hematology ROS (+)   Anesthesia Other Findings No past medical history on file.  Past Surgical History: 05/07/2013: BREAST BIOPSY; Left     Comment:  neg  BMI    Body Mass Index: 31.25 kg/m      Reproductive/Obstetrics negative OB ROS                            Anesthesia Physical Anesthesia Plan  ASA: I  Anesthesia Plan: General   Post-op Pain Management:    Induction:   PONV Risk Score and Plan: Propofol infusion and TIVA  Airway Management Planned: Natural Airway and Nasal Cannula  Additional Equipment:   Intra-op Plan:   Post-operative Plan:   Informed Consent: I have reviewed the patients History and Physical, chart, labs and discussed the procedure including the risks, benefits and alternatives for the proposed anesthesia with the patient or authorized representative who has indicated his/her understanding and acceptance.     Dental Advisory Given  Plan Discussed with: Anesthesiologist, CRNA and Surgeon  Anesthesia Plan Comments:        Anesthesia Quick Evaluation

## 2018-12-11 NOTE — Anesthesia Postprocedure Evaluation (Signed)
Anesthesia Post Note  Patient: Kathleen Washington  Procedure(s) Performed: COLONOSCOPY WITH PROPOFOL (N/A )  Patient location during evaluation: PACU Anesthesia Type: General Level of consciousness: awake and alert Pain management: pain level controlled Vital Signs Assessment: post-procedure vital signs reviewed and stable Respiratory status: spontaneous breathing, nonlabored ventilation and respiratory function stable Cardiovascular status: blood pressure returned to baseline and stable Postop Assessment: no apparent nausea or vomiting Anesthetic complications: no     Last Vitals:  Vitals:   12/11/18 1048 12/11/18 1058  BP: 119/75 106/88  Pulse: 66 65  Resp: 15 13  Temp:    SpO2: 100% 100%    Last Pain:  Vitals:   12/11/18 1058  TempSrc:   PainSc: 0-No pain                 Durenda Hurt

## 2018-12-11 NOTE — Op Note (Signed)
[facility] Gastroenterology Patient Name: Kathleen Washington Procedure Date: 12/11/2018 10:05 AM MRN: [id number] Account #: [id number] Date of Birth: 1966/02/08 Admit Type: Outpatient Age: 53 Room: [facility] ENDO ROOM 4 Gender: Female Note Status: Finalized Procedure:            Colonoscopy Indications:          Screening for colorectal malignant neoplasm Providers:            Jonathon Bellows MD, MD Referring MD:         No Local Md, MD (Referring MD) Medicines:            Monitored Anesthesia Care Complications:        No immediate complications. Procedure:            Pre-Anesthesia Assessment:                       - Prior to the procedure, a History and Physical was                        performed, and patient medications, allergies and                        sensitivities were reviewed. The patient's tolerance of                        previous anesthesia was reviewed.                       - The risks and benefits of the procedure and the                        sedation options and risks were discussed with the                        patient. All questions were answered and informed                        consent was obtained.                       - ASA Grade Assessment: II - A patient with mild                        systemic disease.                       After obtaining informed consent, the colonoscope was                        passed under direct vision. Throughout the procedure,                        the patient's blood pressure, pulse, and oxygen                        saturations were monitored continuously. The                        Colonoscope was introduced through the anus and  advanced to the the cecum, identified by the                        appendiceal orifice. The colonoscopy was performed with                        ease. The patient tolerated the procedure well. The                        quality of the bowel  preparation was excellent. Findings:      The perianal and digital rectal examinations were normal.      A 5 mm polyp was found in the ascending colon. The polyp was sessile.       The polyp was removed with a cold snare. Resection and retrieval were       complete.      The exam was otherwise without abnormality on direct and retroflexion       views. Impression:           - One 5 mm polyp in the ascending colon, removed with a                        cold snare. Resected and retrieved.                       - The examination was otherwise normal on direct and                        retroflexion views. Recommendation:       - Discharge patient to home (with escort).                       - Resume previous diet.                       - Await pathology results.                       - Repeat colonoscopy for surveillance based on                        pathology results. Procedure Code(s):    --- Professional ---                       [id number], Colonoscopy, flexible; with removal of tumor(s),                        polyp(s), or other lesion(s) by snare technique Diagnosis Code(s):    --- Professional ---                       Z12.11, Encounter for screening for malignant neoplasm                        of colon                       K63.5, Polyp of colon CPT copyright 2019 American Medical Association. All rights reserved. The codes documented in this report are preliminary and upon coder review may  be revised to meet current compliance requirements. Jonathon Bellows, MD Jonathon Bellows MD, MD 12/11/2018  10:25:29 AM This report has been signed electronically. Number of Addenda: 0 Note Initiated On: 12/11/2018 10:05 AM Scope Withdrawal Time: 0 hours 11 minutes 22 seconds  Total Procedure Duration: 0 hours 14 minutes 5 seconds  Estimated Blood Loss: Estimated blood loss: none.      [facility]

## 2018-12-11 NOTE — H&P (Signed)
     Jonathon Bellows, MD [address], [place], [place], [place], [address] [address], [place], [place], [place], [address] Phone: [phone]  Fax: [phone]  Primary Care Physician:  Ronnell Freshwater, NP   Pre-Procedure History & Physical: HPI:  Kathleen Washington is a 53 y.o. female is here for an colonoscopy.   No past medical history on file.  Past Surgical History:  Procedure Laterality Date  . BREAST BIOPSY Left 05/07/2013   neg    Prior to Admission medications   Not on File    Allergies as of 12/04/2018  . (No Known Allergies)    No family history on file.  Social History   Socioeconomic History  . Marital status: Divorced    Spouse name: Not on file  . Number of children: Not on file  . Years of education: Not on file  . Highest education level: Not on file  Occupational History  . Not on file  Social Needs  . Financial resource strain: Not on file  . Food insecurity    Worry: Not on file    Inability: Not on file  . Transportation needs    Medical: Not on file    Non-medical: Not on file  Tobacco Use  . Smoking status: Never Smoker  . Smokeless tobacco: Never Used  Substance and Sexual Activity  . Alcohol use: No    Frequency: Never  . Drug use: No  . Sexual activity: Not on file  Lifestyle  . Physical activity    Days per week: Not on file    Minutes per session: Not on file  . Stress: Not on file  Relationships  . Social Herbalist on phone: Not on file    Gets together: Not on file    Attends religious service: Not on file    Active member of club or organization: Not on file    Attends meetings of clubs or organizations: Not on file    Relationship status: Not on file  . Intimate partner violence    Fear of current or ex partner: Not on file    Emotionally abused: Not on file    Physically abused: Not on file    Forced sexual activity: Not on file  Other Topics Concern  . Not on file  Social History  Narrative  . Not on file    Review of Systems: See HPI, otherwise negative ROS  Physical Exam: BP 133/86   Pulse 82   Temp 98.2 F (36.8 C) (Tympanic)   Resp 20   Ht 5' (1.524 m)   Wt 72.6 kg   SpO2 100%   BMI 31.25 kg/m  General:   Alert,  pleasant and cooperative in NAD Head:  Normocephalic and atraumatic. Neck:  Supple; no masses or thyromegaly. Lungs:  Clear throughout to auscultation, normal respiratory effort.    Heart:  +S1, +S2, Regular rate and rhythm, No edema. Abdomen:  Soft, nontender and nondistended. Normal bowel sounds, without guarding, and without rebound.   Neurologic:  Alert and  oriented x4;  grossly normal neurologically.  Impression/Plan: Kathleen Washington is here for an colonoscopy to be performed for Screening colonoscopy average risk   Risks, benefits, limitations, and alternatives regarding  colonoscopy have been reviewed with the patient via spanish interpretor.  Questions have been answered.  All parties agreeable.   Jonathon Bellows, MD  12/11/2018, 10:00 AM

## 2018-12-11 NOTE — Transfer of Care (Signed)
Immediate Anesthesia Transfer of Care Note  Patient: Kathleen Washington  Procedure(s) Performed: COLONOSCOPY WITH PROPOFOL (N/A )  Patient Location: PACU  Anesthesia Type:General  Level of Consciousness: drowsy  Airway & Oxygen Therapy: Patient Spontanous Breathing  Post-op Assessment: Report given to RN and Post -op Vital signs reviewed and stable  Post vital signs: Reviewed and stable  Last Vitals:  Vitals Value Taken Time  BP 92/69   Temp    Pulse    Resp 16   SpO2 100     Last Pain:  Vitals:   12/11/18 0906  TempSrc: Tympanic  PainSc: 0-No pain         Complications: No apparent anesthesia complications

## 2018-12-12 ENCOUNTER — Encounter: Payer: Self-pay | Admitting: Gastroenterology

## 2018-12-12 LAB — SURGICAL PATHOLOGY

## 2018-12-19 ENCOUNTER — Encounter: Payer: Self-pay | Admitting: Gastroenterology

## 2019-04-20 ENCOUNTER — Telehealth: Payer: Self-pay

## 2019-04-20 NOTE — Telephone Encounter (Signed)
LMOM FOR PATIENT TO CONFIRM AND SCREEN FOR 04-24-19 OV.

## 2019-04-24 ENCOUNTER — Encounter: Payer: Self-pay | Admitting: Nurse Practitioner

## 2019-04-24 ENCOUNTER — Ambulatory Visit (INDEPENDENT_AMBULATORY_CARE_PROVIDER_SITE_OTHER): Payer: 59 | Admitting: Nurse Practitioner

## 2019-04-24 ENCOUNTER — Other Ambulatory Visit: Payer: Self-pay

## 2019-04-24 VITALS — BP 116/68 | HR 67 | Temp 97.6°F | Resp 16 | Ht 59.0 in | Wt 133.6 lb

## 2019-04-24 DIAGNOSIS — Z0001 Encounter for general adult medical examination with abnormal findings: Secondary | ICD-10-CM | POA: Diagnosis not present

## 2019-04-24 DIAGNOSIS — M85672 Other cyst of bone, left ankle and foot: Secondary | ICD-10-CM

## 2019-04-24 DIAGNOSIS — M25561 Pain in right knee: Secondary | ICD-10-CM

## 2019-04-24 DIAGNOSIS — G8929 Other chronic pain: Secondary | ICD-10-CM

## 2019-04-24 DIAGNOSIS — M25562 Pain in left knee: Secondary | ICD-10-CM

## 2019-04-24 DIAGNOSIS — M25511 Pain in right shoulder: Secondary | ICD-10-CM

## 2019-04-24 DIAGNOSIS — Z124 Encounter for screening for malignant neoplasm of cervix: Secondary | ICD-10-CM

## 2019-04-24 DIAGNOSIS — R300 Dysuria: Secondary | ICD-10-CM | POA: Diagnosis not present

## 2019-04-24 MED ORDER — IBUPROFEN 600 MG PO TABS: 600.0000 mg | ORAL_TABLET | Freq: Three times a day (TID) | ORAL | 0 refills | Status: AC | PRN

## 2019-04-24 NOTE — Progress Notes (Signed)
[facility] [place], [place] [address]  Internal MEDICINE  Office Visit Note  Patient Name: Kathleen Washington  [id number]  [id number]  Date of Service: 05/02/2019  Chief Complaint  Patient presents with  . Annual Exam  . Gynecologic Exam  . Arm Pain    right arm is hard to raise   . Knee Pain    knees get swollen and is painful  . Foot Pain    on top of the left foot there is a spot that is like a boil and is hard      The patient is here for health maintenance exam and pap smear.  -Pain in right shoulder. Getting worse over past few months. Limiting ROM and strength -pain in both knees. Feel swollen but are not. Hurt with bending and extending the knees. Hurt more when going up and down stairs.  Bony knot on top of left foot. Not tender or painful. Getting larger with time. -family history of rheumatoid arthritis.  -mammogram 11/2018 -due for routine, fasting labs .  Pt is here for routine health maintenance examination  Current Medication: Outpatient Encounter Medications as of 04/24/2019  Medication Sig  . ibuprofen (ADVIL) 600 MG tablet Take 1 tablet (600 mg total) by mouth every 8 (eight) hours as needed.   No facility-administered encounter medications on file as of 04/24/2019.    Surgical History: Past Surgical History:  Procedure Laterality Date  . BREAST BIOPSY Left 05/07/2013   neg  . COLONOSCOPY WITH PROPOFOL N/A 12/11/2018   Procedure: COLONOSCOPY WITH PROPOFOL;  Surgeon: Jonathon Bellows, MD;  Location: [facility] ENDOSCOPY;  Service: Gastroenterology;  Laterality: N/A;    Medical History: History reviewed. No pertinent past medical history.  Family History: History reviewed. No pertinent family history.    Review of Systems  Constitutional: Negative for activity change, chills, fatigue and unexpected weight change.  HENT: Negative for congestion, postnasal drip, rhinorrhea, sneezing and sore throat.   Respiratory: Negative  for cough, chest tightness and shortness of breath.   Cardiovascular: Negative for chest pain and palpitations.  Gastrointestinal: Negative for abdominal pain, constipation, diarrhea, nausea and vomiting.  Endocrine: Negative for cold intolerance, heat intolerance, polydipsia and polyuria.  Genitourinary: Negative for dysuria, frequency, hematuria and urgency.  Musculoskeletal: Positive for arthralgias. Negative for back pain, joint swelling and neck pain.       Right shoulder pain which is gradually getting worse over past few months. Starting to limit range of motion and strength.  Bilateral knee pain. Only sometimes, specifically going up and down stairs.  Cyst like lesion on top of the left foot.   Skin: Negative for rash.  Allergic/Immunologic: Negative for environmental allergies.  Neurological: Negative for dizziness, tremors, numbness and headaches.  Hematological: Negative for adenopathy. Does not bruise/bleed easily.  Psychiatric/Behavioral: Negative for behavioral problems (Depression), sleep disturbance and suicidal ideas. The patient is not nervous/anxious.      Today's Vitals   04/24/19 1418  BP: 116/68  Pulse: 67  Resp: 16  Temp: 97.6 F (36.4 C)  SpO2: 100%  Weight: 133 lb 9.6 oz (60.6 kg)  Height: 4\' 11"  (1.499 m)   Body mass index is 26.98 kg/m.   Physical Exam Vitals and nursing note reviewed.  Constitutional:      General: She is not in acute distress.    Appearance: Normal appearance. She is well-developed. She is not diaphoretic.  HENT:     Head: Normocephalic and atraumatic.  Nose: Nose normal.     Mouth/Throat:     Pharynx: No oropharyngeal exudate.  Eyes:     Pupils: Pupils are equal, round, and reactive to light.  Neck:     Thyroid: No thyromegaly.     Vascular: No carotid bruit or JVD.     Trachea: No tracheal deviation.  Cardiovascular:     Rate and Rhythm: Normal rate and regular rhythm.     Pulses: Normal pulses.     Heart sounds:  Normal heart sounds. No murmur. No friction rub. No gallop.   Pulmonary:     Effort: Pulmonary effort is normal. No respiratory distress.     Breath sounds: Normal breath sounds. No wheezing or rales.  Chest:     Chest wall: No tenderness.     Breasts:        Right: Normal. No swelling, bleeding, inverted nipple, mass, nipple discharge, skin change or tenderness.        Left: Normal. No swelling, bleeding, inverted nipple, mass, nipple discharge, skin change or tenderness.  Abdominal:     General: Bowel sounds are normal.     Palpations: Abdomen is soft.     Tenderness: There is no abdominal tenderness.  Genitourinary:    General: Normal vulva.     Exam position: Supine.     Labia:        Right: No tenderness or lesion.        Left: No tenderness or lesion.      Vagina: Normal. No vaginal discharge, erythema, tenderness or bleeding.     Cervix: No discharge, friability or erythema.     Uterus: Normal.      Adnexa: Right adnexa normal and left adnexa normal.     Comments: No tenderness, masses, or organomeglay present during bimanual exam . Musculoskeletal:        General: Normal range of motion.       Arms:     Cervical back: Normal range of motion and neck supple.       Legs:  Lymphadenopathy:     Cervical: No cervical adenopathy.     Upper Body:     Right upper body: No axillary adenopathy.     Left upper body: No axillary adenopathy.     Lower Body: No right inguinal adenopathy. No left inguinal adenopathy.  Skin:    General: Skin is warm and dry.  Neurological:     Mental Status: She is alert and oriented to person, place, and time.     Cranial Nerves: No cranial nerve deficit.  Psychiatric:        Behavior: Behavior normal.        Thought Content: Thought content normal.        Judgment: Judgment normal.    LABS: Recent Results (from the past 2160 hour(s))  IGP, Aptima HPV     Status: None   Collection Time: 04/24/19  3:00 AM  Result Value Ref Range    Interpretation NILM,QC     Comment: NEGATIVE FOR INTRAEPITHELIAL LESION OR MALIGNANCY. THIS SPECIMEN WAS RESCREENED AS PART OF OUR QUALITY CONTROL PROGRAM.    Category NIL     Comment: Negative for Intraepithelial Lesion   Adequacy SECNI,AOCX     Comment: Satisfactory for evaluation. No endocervical component is identified. The absence of an endocervical component was confirmed by an additional screening evaluation.    Clinician Provided ICD10 Comment     Comment: Z12.4   Performed by: Comment  Comment: Molli Barrows, Cytotechnologist (ASCP)   QC reviewed by: Comment     Comment: Kathrynn Running, Cytotechnologist (ASCP)   Note: Comment     Comment: The Pap smear is a screening test designed to aid in the detection of premalignant and malignant conditions of the uterine cervix.  It is not a diagnostic procedure and should not be used as the sole means of detecting cervical cancer.  Both false-positive and false-negative reports do occur.    Test Methodology Comment     Comment: This liquid based ThinPrep(R) pap test was screened with the use of an image guided system.    HPV Aptima Negative Negative    Comment: This nucleic acid amplification test detects fourteen high-risk HPV types (16,18,31,33,35,39,45,51,52,56,58,59,66,68) without differentiation.   UA/M w/rflx Culture, Routine     Status: None   Collection Time: 04/24/19  3:02 PM   Specimen: Urine   URINE  Result Value Ref Range   Specific Gravity, UA 1.023 1.005 - 1.030   pH, UA 6.0 5.0 - 7.5   Color, UA Yellow Yellow   Appearance Ur Clear Clear   Leukocytes,UA Negative Negative   Protein,UA Negative Negative/Trace   Glucose, UA Negative Negative   Ketones, UA Negative Negative   RBC, UA Negative Negative   Bilirubin, UA Negative Negative   Urobilinogen, Ur 0.2 0.2 - 1.0 mg/dL   Nitrite, UA Negative Negative   Microscopic Examination Comment     Comment: Microscopic follows if indicated.    Microscopic Examination See below:     Comment: Microscopic was indicated and was performed.   Urinalysis Reflex Comment     Comment: This specimen has reflexed to a Urine Culture.  Microscopic Examination     Status: Abnormal   Collection Time: 04/24/19  3:02 PM   URINE  Result Value Ref Range   WBC, UA 6-10 (A) 0 - 5 /hpf   RBC 0-2 0 - 2 /hpf   Epithelial Cells (non renal) 0-10 0 - 10 /hpf   Casts None seen None seen /lpf   Bacteria, UA None seen None seen/Few  Urine Culture, Reflex     Status: Abnormal   Collection Time: 04/24/19  3:02 PM   URINE  Result Value Ref Range   Urine Culture, Routine CANCELED (A)     Comment: Test not performed. Specimen not received at refrigerated temperature.  Result canceled by the ancillary.    Assessment/Plan:  1. Encounter for general adult medical examination with abnormal findings Annual health maintenance exam with pap smear today.   2. Acute pain of right shoulder Add ibuprofen 600mg  up to three times daily as needed for pain. Will get x-ray of the shoulder for further evaluation. Refer to orthopedics as indicated.  - ibuprofen (ADVIL) 600 MG tablet; Take 1 tablet (600 mg total) by mouth every 8 (eight) hours as needed.  Dispense: 30 tablet; Refill: 0 - DG Shoulder Right; Future  3. Chronic pain of both knees Likely related to osteoarthritis. Advised she take NSAIDs as needed and as prescribed. Will monitor.   4. Other cyst of bone, left ankle and foot Soft, cystic lesion on dorsal surface of left foot. Nontender. Will monitor. Refer/image lesion as indicated.   5. Routine cervical smear - IGP, Aptima HPV  6. Dysuria - UA/M w/rflx Culture, Routine   General Counseling: Bexlee verbalizes understanding of the findings of todays visit and agrees with plan of treatment. I have discussed any further diagnostic evaluation that may be needed  or ordered today. We also reviewed her medications today. she has been encouraged to call the  office with any questions or concerns that should arise related to todays visit.    Counseling:  This patient was seen by Leretha Pol FNP Collaboration with Dr Lavera Guise as a part of collaborative care agreement  Orders Placed This Encounter  Procedures  . Microscopic Examination  . Urine Culture, Reflex  . DG Shoulder Right  . UA/M w/rflx Culture, Routine    Meds ordered this encounter  Medications  . ibuprofen (ADVIL) 600 MG tablet    Sig: Take 1 tablet (600 mg total) by mouth every 8 (eight) hours as needed.    Dispense:  30 tablet    Refill:  0    Order Specific Question:   Supervising Provider    Answer:   Lavera Guise [id number]    Total time spent: 89 Minutes  Time spent includes review of chart, medications, test results, and follow up plan with the patient.     Lavera Guise, MD  Internal Medicine

## 2019-04-25 NOTE — Progress Notes (Signed)
Wait for culture and sensitivity results.

## 2019-04-26 LAB — MICROSCOPIC EXAMINATION
Bacteria, UA: NONE SEEN
Casts: NONE SEEN /lpf

## 2019-04-26 LAB — UA/M W/RFLX CULTURE, ROUTINE
Bilirubin, UA: NEGATIVE
Glucose, UA: NEGATIVE
Ketones, UA: NEGATIVE
Leukocytes,UA: NEGATIVE
Nitrite, UA: NEGATIVE
Protein,UA: NEGATIVE
RBC, UA: NEGATIVE
Specific Gravity, UA: 1.023 (ref 1.005–1.030)
Urobilinogen, Ur: 0.2 mg/dL (ref 0.2–1.0)
pH, UA: 6 (ref 5.0–7.5)

## 2019-04-26 LAB — URINE CULTURE, REFLEX

## 2019-04-30 LAB — IGP, APTIMA HPV: HPV Aptima: NEGATIVE

## 2019-04-30 NOTE — Progress Notes (Signed)
Please let patient know that her pap smear was normal. Thanks.

## 2019-05-02 DIAGNOSIS — G8929 Other chronic pain: Secondary | ICD-10-CM | POA: Insufficient documentation

## 2019-05-02 DIAGNOSIS — Z124 Encounter for screening for malignant neoplasm of cervix: Secondary | ICD-10-CM | POA: Insufficient documentation

## 2019-05-02 DIAGNOSIS — M25562 Pain in left knee: Secondary | ICD-10-CM | POA: Insufficient documentation

## 2019-05-02 DIAGNOSIS — M85672 Other cyst of bone, left ankle and foot: Secondary | ICD-10-CM | POA: Insufficient documentation

## 2019-05-02 DIAGNOSIS — Z0001 Encounter for general adult medical examination with abnormal findings: Secondary | ICD-10-CM | POA: Insufficient documentation

## 2019-05-02 DIAGNOSIS — M25511 Pain in right shoulder: Secondary | ICD-10-CM | POA: Insufficient documentation

## 2019-05-03 ENCOUNTER — Telehealth: Payer: Self-pay

## 2019-05-03 NOTE — Telephone Encounter (Signed)
-----   Message from Ronnell Freshwater, NP sent at 04/30/2019 11:41 AM EDT ----- Please let patient know that her pap smear was normal. Thanks.

## 2019-05-03 NOTE — Telephone Encounter (Signed)
Pt was notified.  

## 2019-05-04 ENCOUNTER — Other Ambulatory Visit: Payer: Self-pay | Admitting: Nurse Practitioner

## 2019-05-04 DIAGNOSIS — M064 Inflammatory polyarthropathy: Secondary | ICD-10-CM | POA: Diagnosis not present

## 2019-05-04 DIAGNOSIS — E559 Vitamin D deficiency, unspecified: Secondary | ICD-10-CM | POA: Diagnosis not present

## 2019-05-04 DIAGNOSIS — Z0001 Encounter for general adult medical examination with abnormal findings: Secondary | ICD-10-CM | POA: Diagnosis not present

## 2019-05-05 LAB — COMPREHENSIVE METABOLIC PANEL
ALT: 29 IU/L (ref 0–32)
AST: 27 IU/L (ref 0–40)
Albumin/Globulin Ratio: 1.7 (ref 1.2–2.2)
Albumin: 4.4 g/dL (ref 3.8–4.9)
Alkaline Phosphatase: 120 IU/L — ABNORMAL HIGH (ref 39–117)
BUN/Creatinine Ratio: 23 (ref 9–23)
BUN: 15 mg/dL (ref 6–24)
Bilirubin Total: 0.4 mg/dL (ref 0.0–1.2)
CO2: 22 mmol/L (ref 20–29)
Calcium: 9.7 mg/dL (ref 8.7–10.2)
Chloride: 105 mmol/L (ref 96–106)
Creatinine, Ser: 0.65 mg/dL (ref 0.57–1.00)
GFR calc Af Amer: 116 mL/min/{1.73_m2} (ref >59)
GFR calc non Af Amer: 101 mL/min/{1.73_m2} (ref >59)
Globulin, Total: 2.6 g/dL (ref 1.5–4.5)
Glucose: 97 mg/dL (ref 65–99)
Potassium: 4 mmol/L (ref 3.5–5.2)
Sodium: 141 mmol/L (ref 134–144)
Total Protein: 7 g/dL (ref 6.0–8.5)

## 2019-05-05 LAB — VITAMIN D 25 HYDROXY (VIT D DEFICIENCY, FRACTURES): Vit D, 25-Hydroxy: 28.7 ng/mL — ABNORMAL LOW (ref 30.0–100.0)

## 2019-05-05 LAB — CBC
Hematocrit: 40.6 % (ref 34.0–46.6)
Hemoglobin: 13.2 g/dL (ref 11.1–15.9)
MCH: 30.3 pg (ref 26.6–33.0)
MCHC: 32.5 g/dL (ref 31.5–35.7)
MCV: 93 fL (ref 79–97)
Platelets: 232 10*3/uL (ref 150–450)
RBC: 4.35 x10E6/uL (ref 3.77–5.28)
RDW: 12 % (ref 11.7–15.4)
WBC: 4.6 10*3/uL (ref 3.4–10.8)

## 2019-05-05 LAB — RHEUMATOID FACTOR: Rheumatoid fact SerPl-aCnc: <10 IU/mL (ref 0.0–13.9)

## 2019-05-05 LAB — T4, FREE: Free T4: 1.03 ng/dL (ref 0.82–1.77)

## 2019-05-05 LAB — LIPID PANEL W/O CHOL/HDL RATIO
Cholesterol, Total: 205 mg/dL — ABNORMAL HIGH (ref 100–199)
HDL: 83 mg/dL (ref >39)
LDL Chol Calc (NIH): 115 mg/dL — ABNORMAL HIGH (ref 0–99)
Triglycerides: 35 mg/dL (ref 0–149)
VLDL Cholesterol Cal: 7 mg/dL (ref 5–40)

## 2019-05-05 LAB — SEDIMENTATION RATE: Sed Rate: 14 mm/hr (ref 0–40)

## 2019-05-05 LAB — ANA W/REFLEX IF POSITIVE: Anti Nuclear Antibody (ANA): NEGATIVE

## 2019-05-05 LAB — TSH: TSH: 0.971 u[IU]/mL (ref 0.450–4.500)

## 2019-05-05 LAB — URIC ACID: Uric Acid: 4.2 mg/dL (ref 3.0–7.2)

## 2019-05-20 NOTE — Progress Notes (Signed)
Please let the patient know that her labs look good. Thanks.

## 2019-05-31 ENCOUNTER — Telehealth: Payer: Self-pay

## 2019-05-31 DIAGNOSIS — H40003 Preglaucoma, unspecified, bilateral: Secondary | ICD-10-CM | POA: Diagnosis not present

## 2019-05-31 NOTE — Telephone Encounter (Signed)
Pt did not answer phone call, left message to call back and mailed out lab results

## 2019-05-31 NOTE — Telephone Encounter (Signed)
-----   Message from Ronnell Freshwater, NP sent at 05/20/2019  7:55 PM EDT ----- Please let the patient know that her labs look good. Thanks.

## 2019-07-03 DIAGNOSIS — Z03818 Encounter for observation for suspected exposure to other biological agents ruled out: Secondary | ICD-10-CM | POA: Diagnosis not present

## 2020-01-08 ENCOUNTER — Other Ambulatory Visit: Payer: Self-pay | Admitting: Nurse Practitioner

## 2020-01-08 DIAGNOSIS — Z1231 Encounter for screening mammogram for malignant neoplasm of breast: Secondary | ICD-10-CM

## 2020-02-13 ENCOUNTER — Ambulatory Visit
Admission: RE | Admit: 2020-02-13 | Discharge: 2020-02-13 | Disposition: A | Discharge status: Home / Self Care | Payer: 59 | Source: Ambulatory Visit | Attending: Nurse Practitioner | Admitting: Nurse Practitioner

## 2020-02-13 ENCOUNTER — Other Ambulatory Visit: Payer: Self-pay

## 2020-02-13 DIAGNOSIS — Z1231 Encounter for screening mammogram for malignant neoplasm of breast: Secondary | ICD-10-CM | POA: Insufficient documentation

## 2020-04-28 ENCOUNTER — Other Ambulatory Visit: Payer: Self-pay

## 2020-04-28 ENCOUNTER — Encounter: Payer: Self-pay | Admitting: Physician Assistant

## 2020-04-28 ENCOUNTER — Ambulatory Visit (INDEPENDENT_AMBULATORY_CARE_PROVIDER_SITE_OTHER): Payer: 59 | Admitting: Physician Assistant

## 2020-04-28 DIAGNOSIS — E559 Vitamin D deficiency, unspecified: Secondary | ICD-10-CM | POA: Diagnosis not present

## 2020-04-28 DIAGNOSIS — R300 Dysuria: Secondary | ICD-10-CM | POA: Diagnosis not present

## 2020-04-28 DIAGNOSIS — M25511 Pain in right shoulder: Secondary | ICD-10-CM

## 2020-04-28 DIAGNOSIS — Z0001 Encounter for general adult medical examination with abnormal findings: Secondary | ICD-10-CM

## 2020-04-28 DIAGNOSIS — G8929 Other chronic pain: Secondary | ICD-10-CM

## 2020-04-28 NOTE — Progress Notes (Signed)
[facility], [place] [address]  Internal MEDICINE  Office Visit Note  Patient Name: Kathleen Washington  [id number]  [id number]  Date of Service: 04/29/2020  Chief Complaint  Patient presents with  . Annual Exam     HPI Pt is here for routine health maintenance examination and has no complaints today. -UTD on colonscopy in 2020 and mammogram in Jan 2022. Pap done last year and was normal, repeat in 2024 -Not taking any medications except ibuprofen for when R shoulder pain acts up. 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 -She is due for routine fasting labs, educated that vitamin D was low last year and may need to start OTC daily supplement pending repeat lab results  Current Medication: Outpatient Encounter Medications as of 04/28/2020  Medication Sig  . ibuprofen (ADVIL) 600 MG tablet Take 1 tablet (600 mg total) by mouth every 8 (eight) hours as needed.   No facility-administered encounter medications on file as of 04/28/2020.    Surgical History: Past Surgical History:  Procedure Laterality Date  . BREAST BIOPSY Left 05/07/2013   neg  . COLONOSCOPY WITH PROPOFOL N/A 12/11/2018   Procedure: COLONOSCOPY WITH PROPOFOL;  Surgeon: Jonathon Bellows, MD;  Location: [facility];  Service: Gastroenterology;  Laterality: N/A;    Medical History: History reviewed. No pertinent past medical history.  Family History: History reviewed. No pertinent family history.    Review of Systems  Constitutional: Negative for chills, fatigue and unexpected weight change.  HENT: Negative for congestion, postnasal drip, rhinorrhea, sneezing and sore throat.   Eyes: Negative for redness.  Respiratory: Negative for cough, chest tightness and shortness of breath.   Cardiovascular: Negative for chest pain and palpitations.  Gastrointestinal: Negative for abdominal pain, constipation, diarrhea, nausea and vomiting.  Genitourinary:  Negative for dysuria and frequency.  Musculoskeletal: Positive for arthralgias. Negative for back pain, joint swelling and neck pain.       R shoulder pain occasionally at night for years  Skin: Negative for rash.  Neurological: Negative.  Negative for tremors and numbness.  Hematological: Negative for adenopathy. Does not bruise/bleed easily.  Psychiatric/Behavioral: Negative for behavioral problems (Depression), sleep disturbance and suicidal ideas. The patient is not nervous/anxious.      Vital Signs: BP 120/74   Pulse 73   Temp 98 F (36.7 C)   Resp 16   Ht 4\' 11"  (1.499 m)   Wt 132 lb (59.9 kg)   SpO2 97%   BMI 26.66 kg/m    Physical Exam Vitals and nursing note reviewed.  Constitutional:      General: She is not in acute distress.    Appearance: She is well-developed. She is not diaphoretic.  HENT:     Head: Normocephalic and atraumatic.     Right Ear: External ear normal.     Left Ear: External ear normal.     Nose: Nose normal.     Mouth/Throat:     Pharynx: No oropharyngeal exudate.  Eyes:     General: No scleral icterus.       Right eye: No discharge.        Left eye: No discharge.     Conjunctiva/sclera: Conjunctivae normal.     Pupils: Pupils are equal, round, and reactive to light.  Neck:     Thyroid: No thyromegaly.     Vascular: No JVD.     Trachea: No tracheal deviation.  Cardiovascular:     Rate and  Rhythm: Normal rate and regular rhythm.     Heart sounds: Normal heart sounds. No murmur heard. No friction rub. No gallop.   Pulmonary:     Effort: Pulmonary effort is normal. No respiratory distress.     Breath sounds: Normal breath sounds. No stridor. No wheezing or rales.  Chest:     Chest wall: No tenderness.  Breasts:     Right: Normal. No mass.     Left: Normal. No mass.    Abdominal:     General: Bowel sounds are normal. There is no distension.     Palpations: Abdomen is soft. There is no mass.     Tenderness: There is no abdominal  tenderness. There is no guarding or rebound.  Musculoskeletal:        General: No tenderness or deformity. Normal range of motion.     Cervical back: Normal range of motion and neck supple.     Right lower leg: No edema.     Left lower leg: No edema.     Comments: R shoulder is not TTP and pt still has full ROM  Lymphadenopathy:     Cervical: No cervical adenopathy.  Skin:    General: Skin is warm and dry.     Coloration: Skin is not pale.     Findings: No erythema or rash.  Neurological:     Mental Status: She is alert and oriented to person, place, and time.     Cranial Nerves: No cranial nerve deficit.     Motor: No abnormal muscle tone.     Coordination: Coordination normal.     Deep Tendon Reflexes: Reflexes are normal and symmetric.  Psychiatric:        Behavior: Behavior normal.        Thought Content: Thought content normal.        Judgment: Judgment normal.      LABS: Recent Results (from the past 2160 hour(s))  UA/M w/rflx Culture, Routine     Status: None   Collection Time: 04/28/20  4:35 PM   Specimen: Urine   Urine  Result Value Ref Range   Specific Gravity, UA 1.008 1.005 - 1.030   pH, UA 7.5 5.0 - 7.5   Color, UA Yellow Yellow   Appearance Ur Clear Clear   Leukocytes,UA Negative Negative   Protein,UA Negative Negative/Trace   Glucose, UA Negative Negative   Ketones, UA Negative Negative   RBC, UA Negative Negative   Bilirubin, UA Negative Negative   Urobilinogen, Ur 0.2 0.2 - 1.0 mg/dL   Nitrite, UA Negative Negative   Microscopic Examination Comment     Comment: Microscopic follows if indicated.   Microscopic Examination See below:     Comment: Microscopic was indicated and was performed.   Urinalysis Reflex Comment     Comment: This specimen will not reflex to a Urine Culture.  Microscopic Examination     Status: None   Collection Time: 04/28/20  4:35 PM   Urine  Result Value Ref Range   WBC, UA None seen 0 - 5 /hpf   RBC None seen 0 - 2 /hpf    Epithelial Cells (non renal) 0-10 0 - 10 /hpf   Casts None seen None seen /lpf   Bacteria, UA None seen None seen/Few       Assessment/Plan: 1. Encounter for general adult medical examination with abnormal findings Pt is due for routine fasting labs-lab slip given; UTD on mammogram, pap, and colonoscopy  2. Chronic  right shoulder pain Pain in R shoulder comes and goes, usually present at night. Uses ibuprofen as needed--does not use very frequently. Never went for Xray and she does not feel that she needs any further workup at this time.  3. Vitamin D deficiency Low on last labs and denies supplementing, will repeat labs and will likely need to begin supplement pending results.  4. Dysuria - UA/M w/rflx Culture, Routine   General Counseling: Marvel verbalizes understanding of the findings of todays visit and agrees with plan of treatment. I have discussed any further diagnostic evaluation that may be needed or ordered today. We also reviewed her medications today. she has been encouraged to call the office with any questions or concerns that should arise related to todays visit.    Counseling:    Orders Placed This Encounter  Procedures  . Microscopic Examination  . UA/M w/rflx Culture, Routine    No orders of the defined types were placed in this encounter.   This patient was seen by Drema Dallas, PA-C in collaboration with Dr. Clayborn Bigness as a part of collaborative care agreement.  Total time spent:30 Minutes  Time spent includes review of chart, medications, test results, and follow up plan with the patient.     Lavera Guise, MD  Internal Medicine

## 2020-04-29 LAB — UA/M W/RFLX CULTURE, ROUTINE
Bilirubin, UA: NEGATIVE
Glucose, UA: NEGATIVE
Ketones, UA: NEGATIVE
Leukocytes,UA: NEGATIVE
Nitrite, UA: NEGATIVE
Protein,UA: NEGATIVE
RBC, UA: NEGATIVE
Specific Gravity, UA: 1.008 (ref 1.005–1.030)
Urobilinogen, Ur: 0.2 mg/dL (ref 0.2–1.0)
pH, UA: 7.5 (ref 5.0–7.5)

## 2020-04-29 LAB — MICROSCOPIC EXAMINATION
Bacteria, UA: NONE SEEN
Casts: NONE SEEN /lpf
RBC, Urine: NONE SEEN /hpf (ref 0–2)
WBC, UA: NONE SEEN /hpf (ref 0–5)

## 2020-05-02 ENCOUNTER — Other Ambulatory Visit: Payer: Self-pay | Admitting: Physician Assistant

## 2020-05-03 LAB — CBC WITH DIFFERENTIAL/PLATELET
Basophils Absolute: 0 10*3/uL (ref 0.0–0.2)
Basos: 1 %
EOS (ABSOLUTE): 0.1 10*3/uL (ref 0.0–0.4)
Eos: 2 %
Hematocrit: 40.9 % (ref 34.0–46.6)
Hemoglobin: 13.6 g/dL (ref 11.1–15.9)
Immature Grans (Abs): 0 10*3/uL (ref 0.0–0.1)
Immature Granulocytes: 0 %
Lymphocytes Absolute: 1.6 10*3/uL (ref 0.7–3.1)
Lymphs: 35 %
MCH: 30.7 pg (ref 26.6–33.0)
MCHC: 33.3 g/dL (ref 31.5–35.7)
MCV: 92 fL (ref 79–97)
Monocytes Absolute: 0.3 10*3/uL (ref 0.1–0.9)
Monocytes: 7 %
Neutrophils Absolute: 2.6 10*3/uL (ref 1.4–7.0)
Neutrophils: 55 %
Platelets: 242 10*3/uL (ref 150–450)
RBC: 4.43 x10E6/uL (ref 3.77–5.28)
RDW: 12.2 % (ref 11.7–15.4)
WBC: 4.6 10*3/uL (ref 3.4–10.8)

## 2020-05-03 LAB — COMPREHENSIVE METABOLIC PANEL
ALT: 54 IU/L — ABNORMAL HIGH (ref 0–32)
AST: 44 IU/L — ABNORMAL HIGH (ref 0–40)
Albumin/Globulin Ratio: 1.8 (ref 1.2–2.2)
Albumin: 4.7 g/dL (ref 3.8–4.9)
Alkaline Phosphatase: 120 IU/L (ref 44–121)
BUN/Creatinine Ratio: 20 (ref 9–23)
BUN: 14 mg/dL (ref 6–24)
Bilirubin Total: 0.7 mg/dL (ref 0.0–1.2)
CO2: 21 mmol/L (ref 20–29)
Calcium: 9.8 mg/dL (ref 8.7–10.2)
Chloride: 103 mmol/L (ref 96–106)
Creatinine, Ser: 0.7 mg/dL (ref 0.57–1.00)
Globulin, Total: 2.6 g/dL (ref 1.5–4.5)
Glucose: 96 mg/dL (ref 65–99)
Potassium: 4.6 mmol/L (ref 3.5–5.2)
Sodium: 141 mmol/L (ref 134–144)
Total Protein: 7.3 g/dL (ref 6.0–8.5)
eGFR: 102 mL/min/{1.73_m2} (ref >59)

## 2020-05-03 LAB — T4, FREE: Free T4: 1.01 ng/dL (ref 0.82–1.77)

## 2020-05-03 LAB — IRON AND TIBC
Iron Saturation: 48 % (ref 15–55)
Iron: 152 ug/dL (ref 27–159)
Total Iron Binding Capacity: 315 ug/dL (ref 250–450)
UIBC: 163 ug/dL (ref 131–425)

## 2020-05-03 LAB — LIPID PANEL W/O CHOL/HDL RATIO
Cholesterol, Total: 216 mg/dL — ABNORMAL HIGH (ref 100–199)
HDL: 80 mg/dL (ref >39)
LDL Chol Calc (NIH): 128 mg/dL — ABNORMAL HIGH (ref 0–99)
Triglycerides: 48 mg/dL (ref 0–149)
VLDL Cholesterol Cal: 8 mg/dL (ref 5–40)

## 2020-05-03 LAB — FERRITIN: Ferritin: 74 ng/mL (ref 15–150)

## 2020-05-03 LAB — VITAMIN D 25 HYDROXY (VIT D DEFICIENCY, FRACTURES): Vit D, 25-Hydroxy: 23.4 ng/mL — ABNORMAL LOW (ref 30.0–100.0)

## 2020-05-03 LAB — B12 AND FOLATE PANEL
Folate: 19 ng/mL (ref >3.0)
Vitamin B-12: 595 pg/mL (ref 232–1245)

## 2020-05-03 LAB — TSH: TSH: 1.15 u[IU]/mL (ref 0.450–4.500)

## 2021-04-24 ENCOUNTER — Telehealth: Payer: Self-pay

## 2021-04-24 NOTE — Telephone Encounter (Signed)
Lvm w patient's son to confirm 04/30/21 appointment-Toni ?

## 2021-04-30 ENCOUNTER — Encounter: Payer: Self-pay | Admitting: Physician Assistant

## 2021-04-30 ENCOUNTER — Ambulatory Visit (INDEPENDENT_AMBULATORY_CARE_PROVIDER_SITE_OTHER): Payer: Managed Care, Other (non HMO) | Admitting: Physician Assistant

## 2021-04-30 ENCOUNTER — Other Ambulatory Visit: Payer: Self-pay

## 2021-04-30 VITALS — BP 105/68 | HR 67 | Temp 98.3°F | Resp 16 | Ht 60.0 in | Wt 134.0 lb

## 2021-04-30 DIAGNOSIS — E782 Mixed hyperlipidemia: Secondary | ICD-10-CM | POA: Diagnosis not present

## 2021-04-30 DIAGNOSIS — Z23 Encounter for immunization: Secondary | ICD-10-CM

## 2021-04-30 DIAGNOSIS — R7989 Other specified abnormal findings of blood chemistry: Secondary | ICD-10-CM | POA: Diagnosis not present

## 2021-04-30 DIAGNOSIS — E559 Vitamin D deficiency, unspecified: Secondary | ICD-10-CM

## 2021-04-30 DIAGNOSIS — Z0001 Encounter for general adult medical examination with abnormal findings: Secondary | ICD-10-CM | POA: Diagnosis not present

## 2021-04-30 DIAGNOSIS — R300 Dysuria: Secondary | ICD-10-CM

## 2021-04-30 DIAGNOSIS — R5383 Other fatigue: Secondary | ICD-10-CM

## 2021-04-30 DIAGNOSIS — Z01419 Encounter for gynecological examination (general) (routine) without abnormal findings: Secondary | ICD-10-CM

## 2021-04-30 DIAGNOSIS — Z1231 Encounter for screening mammogram for malignant neoplasm of breast: Secondary | ICD-10-CM

## 2021-04-30 MED ORDER — ZOSTER VAC RECOMB ADJUVANTED 50 MCG/0.5ML IM SUSR: 0.5000 mL | Freq: Once | INTRAMUSCULAR | 0 refills | Status: AC

## 2021-04-30 NOTE — Progress Notes (Signed)
?[facility] ?[address] ?[place], [place] [address] ? ?Internal MEDICINE  ?Office Visit Note ? ?Patient Name: Kathleen Washington ? [id number]  ?[id number] ? ?Date of Service: 05/05/2021 ? ?Chief Complaint  ?Patient presents with  ? Annual Exam  ? ? ? 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 Wants to hold off on tdap at this time ?-UTD on colonoscopy in 2020 and pap in 2021 ? ?Current Medication: ?Outpatient Encounter Medications as of 04/30/2021  ?Medication Sig  ? ibuprofen (ADVIL) 600 MG tablet Take 1 tablet (600 mg total) by mouth every 8 (eight) hours as needed.  ? [DISCONTINUED] Zoster Vaccine Adjuvanted [facility]) injection Inject 0.5 mLs into the muscle once.  ? [EXPIRED] Zoster Vaccine Adjuvanted [facility]) injection Inject 0.5 mLs into the muscle once for 1 dose.  ? ?No facility-administered encounter medications on file as of 04/30/2021.  ? ? ?Surgical History: ?Past Surgical History:  ?Procedure Laterality Date  ? BREAST BIOPSY Left 05/07/2013  ? neg  ? COLONOSCOPY WITH PROPOFOL N/A 12/11/2018  ? Procedure: COLONOSCOPY WITH PROPOFOL;  Surgeon: Jonathon Bellows, MD;  Location: [facility];  Service: Gastroenterology;  Laterality: N/A;  ? ? ?Medical History: ?History reviewed. No pertinent past medical history. ? ?Family History: ?History reviewed. No pertinent family history. ? ? ? ?Review of Systems  ?Constitutional:  Negative for chills, fatigue and unexpected weight change.  ?HENT:  Negative for congestion, postnasal drip, rhinorrhea, sneezing and sore throat.   ?Eyes:  Negative for redness.  ?Respiratory:  Negative for cough, chest tightness and shortness of breath.    ?Cardiovascular:  Negative for chest pain and palpitations.  ?Gastrointestinal:  Negative for abdominal pain, constipation, diarrhea, nausea and vomiting.  ?Genitourinary:  Negative for dysuria and frequency.  ?Musculoskeletal:  Negative for arthralgias, back pain, joint swelling and neck pain.  ?Skin:  Negative for rash.  ?Neurological: Negative.  Negative for tremors and numbness.  ?Hematological:  Negative for adenopathy. Does not bruise/bleed easily.  ?Psychiatric/Behavioral:  Negative for behavioral problems (Depression), sleep disturbance and suicidal ideas. The patient is not nervous/anxious.   ? ? ?Vital Signs: ?BP 105/68   Pulse 67   Temp 98.3 ?F (36.8 ?C)   Resp 16   Ht 5' (1.524 m)   Wt 134 lb (60.8 kg)   SpO2 98%   BMI 26.17 kg/m?  ? ? ?Physical Exam ?Vitals and nursing note reviewed.  ?Constitutional:   ?   General: She is not in acute distress. ?   Appearance: She is well-developed. She is not diaphoretic.  ?HENT:  ?   Head: Normocephalic and atraumatic.  ?   Mouth/Throat:  ?   Pharynx: No oropharyngeal exudate.  ?Eyes:  ?   Pupils: Pupils are equal, round, and reactive to light.  ?Neck:  ?   Thyroid: No thyromegaly.  ?   Vascular: No JVD.  ?   Trachea: No tracheal deviation.  ?Cardiovascular:  ?   Rate and Rhythm: Normal rate and regular rhythm.  ?   Heart sounds: Normal heart sounds. No murmur heard. ?  No friction rub. No gallop.  ?Pulmonary:  ?   Effort: Pulmonary effort is normal. No respiratory distress.  ?  Breath sounds: No wheezing or rales.  ?Chest:  ?   Chest wall: No tenderness.  ?Breasts: ?   Right: Normal. No mass.  ?   Left: Normal. No mass.  ?Abdominal:  ?   General: Bowel sounds are normal.  ?   Palpations: Abdomen is soft.  ?   Tenderness: There is no abdominal tenderness.  ?Musculoskeletal:     ?   General: Normal range of motion.  ?   Cervical back: Normal range of motion and neck supple.  ?Lymphadenopathy:  ?   Cervical: No cervical adenopathy.  ?Skin: ?   General: Skin is  warm and dry.  ?Neurological:  ?   Mental Status: She is alert and oriented to person, place, and time.  ?   Cranial Nerves: No cranial nerve deficit.  ?Psychiatric:     ?   Behavior: Behavior normal.     ?   Thought Content: Thought content normal.     ?   Judgment: Judgment normal.  ? ? ? ?LABS: ?Recent Results (from the past 2160 hour(s))  ?UA/M w/rflx Culture, Routine     Status: Abnormal  ? Collection Time: 04/30/21  4:03 PM  ? Specimen: Urine  ? Urine  ?Result Value Ref Range  ? Specific Gravity, UA 1.022 1.005 - 1.030  ? pH, UA 5.0 5.0 - 7.5  ? Color, UA Yellow Yellow  ? Appearance Ur Clear Clear  ? Leukocytes,UA 1+ (A) Negative  ? Protein,UA Negative Negative/Trace  ? Glucose, UA Negative Negative  ? Ketones, UA Negative Negative  ? RBC, UA Negative Negative  ? Bilirubin, UA Negative Negative  ? Urobilinogen, Ur 0.2 0.2 - 1.0 mg/dL  ? Nitrite, UA Negative Negative  ? Microscopic Examination See below:   ?  Comment: Microscopic was indicated and was performed.  ? Urinalysis Reflex Comment   ?  Comment: This specimen has reflexed to a Urine Culture.  ?Microscopic Examination     Status: Abnormal  ? Collection Time: 04/30/21  4:03 PM  ? Urine  ?Result Value Ref Range  ? WBC, UA 6-10 (A) 0 - 5 /hpf  ? RBC 0-2 0 - 2 /hpf  ? Epithelial Cells (non renal) 0-10 0 - 10 /hpf  ? Casts None seen None seen /lpf  ? Crystals Present (A) N/A  ? Crystal Type Calcium Oxalate N/A  ? Bacteria, UA None seen None seen/Few  ?Urine Culture, Reflex     Status: None  ? Collection Time: 04/30/21  4:03 PM  ? Urine  ?Result Value Ref Range  ? Urine Culture, Routine Final report   ? Organism ID, Bacteria No growth   ? ? ? ? ? ? ?Assessment/Plan: ?1. Encounter for general adult medical examination with abnormal findings ?CPE performed, routine fasting labs ordered, mammogram ordered ? ?2. Visit for gynecologic examination ?Breast exam performed ? ?3. Visit for screening mammogram ?- MM 3D SCREEN BREAST BILATERAL; Future ? ?4. Need for  shingles vaccine ?- Zoster Vaccine Adjuvanted [facility]) injection; Inject 0.5 mLs into the muscle once for 1 dose.  Dispense: 0.5 mL; Refill: 0 ? ?5. Vitamin D deficiency ?- VITAMIN D 25 Hydroxy (Vit-D Deficiency, Fractures) ? ?6. Mixed hyperlipidemia ?- Lipid Panel With LDL/HDL Ratio ? ?7. Abnormal thyroid blood test ?- TSH + free T4 ? ?8. Other fatigue ?- CBC w/Diff/Platelet ?- Comprehensive metabolic panel ?- TSH + free T4 ?- Lipid Panel With LDL/HDL Ratio ?- VITAMIN D 25 Hydroxy (Vit-D Deficiency, Fractures) ? ?9. Dysuria ?-  UA/M w/rflx Culture, Routine ? ? ?General Counseling: Destaney verbalizes understanding of the findings of todays visit and agrees with plan of treatment. I have discussed any further diagnostic evaluation that may be needed or ordered today. We also reviewed her medications today. she has been encouraged to call the office with any questions or concerns that should arise related to todays visit. ? ? ? ?Counseling: ? ? ? ?Orders Placed This Encounter  ?Procedures  ? Microscopic Examination  ? Urine Culture, Reflex  ? MM 3D SCREEN BREAST BILATERAL  ? UA/M w/rflx Culture, Routine  ? CBC w/Diff/Platelet  ? Comprehensive metabolic panel  ? TSH + free T4  ? Lipid Panel With LDL/HDL Ratio  ? VITAMIN D 25 Hydroxy (Vit-D Deficiency, Fractures)  ? ? ?Meds ordered this encounter  ?Medications  ? Zoster Vaccine Adjuvanted [facility]) injection  ?  Sig: Inject 0.5 mLs into the muscle once for 1 dose.  ?  Dispense:  0.5 mL  ?  Refill:  0  ? ? ?This patient was seen by Drema Dallas, PA-C in collaboration with Dr. Clayborn Bigness as a part of collaborative care agreement. ? ?Total time spent:30 Minutes ? ?Time spent includes review of chart, medications, test results, and follow up plan with the patient.  ? ? ? ?Lavera Guise, MD ? ?Internal Medicine ? ?

## 2021-05-04 LAB — UA/M W/RFLX CULTURE, ROUTINE
Bilirubin, UA: NEGATIVE
Glucose, UA: NEGATIVE
Ketones, UA: NEGATIVE
Nitrite, UA: NEGATIVE
Protein,UA: NEGATIVE
RBC, UA: NEGATIVE
Specific Gravity, UA: 1.022 (ref 1.005–1.030)
Urobilinogen, Ur: 0.2 mg/dL (ref 0.2–1.0)
pH, UA: 5 (ref 5.0–7.5)

## 2021-05-04 LAB — MICROSCOPIC EXAMINATION
Bacteria, UA: NONE SEEN
Casts: NONE SEEN /lpf

## 2021-05-04 LAB — URINE CULTURE, REFLEX: Organism ID, Bacteria: NO GROWTH

## 2021-05-07 ENCOUNTER — Other Ambulatory Visit: Payer: Self-pay | Admitting: Physician Assistant

## 2021-05-08 LAB — CBC WITH DIFFERENTIAL/PLATELET
Basophils Absolute: 0 10*3/uL (ref 0.0–0.2)
Basos: 0 %
EOS (ABSOLUTE): 0.1 10*3/uL (ref 0.0–0.4)
Eos: 1 %
Hematocrit: 40.8 % (ref 34.0–46.6)
Hemoglobin: 13.3 g/dL (ref 11.1–15.9)
Immature Grans (Abs): 0 10*3/uL (ref 0.0–0.1)
Immature Granulocytes: 0 %
Lymphocytes Absolute: 2 10*3/uL (ref 0.7–3.1)
Lymphs: 40 %
MCH: 30.2 pg (ref 26.6–33.0)
MCHC: 32.6 g/dL (ref 31.5–35.7)
MCV: 93 fL (ref 79–97)
Monocytes Absolute: 0.3 10*3/uL (ref 0.1–0.9)
Monocytes: 6 %
Neutrophils Absolute: 2.6 10*3/uL (ref 1.4–7.0)
Neutrophils: 53 %
Platelets: 258 10*3/uL (ref 150–450)
RBC: 4.41 x10E6/uL (ref 3.77–5.28)
RDW: 12.4 % (ref 11.7–15.4)
WBC: 5.1 10*3/uL (ref 3.4–10.8)

## 2021-05-08 LAB — COMPREHENSIVE METABOLIC PANEL
ALT: 54 IU/L — ABNORMAL HIGH (ref 0–32)
AST: 38 IU/L (ref 0–40)
Albumin/Globulin Ratio: 1.9 (ref 1.2–2.2)
Albumin: 4.6 g/dL (ref 3.8–4.9)
Alkaline Phosphatase: 127 IU/L — ABNORMAL HIGH (ref 44–121)
BUN/Creatinine Ratio: 29 — ABNORMAL HIGH (ref 9–23)
BUN: 21 mg/dL (ref 6–24)
Bilirubin Total: 0.5 mg/dL (ref 0.0–1.2)
CO2: 23 mmol/L (ref 20–29)
Calcium: 9.5 mg/dL (ref 8.7–10.2)
Chloride: 105 mmol/L (ref 96–106)
Creatinine, Ser: 0.73 mg/dL (ref 0.57–1.00)
Globulin, Total: 2.4 g/dL (ref 1.5–4.5)
Glucose: 90 mg/dL (ref 70–99)
Potassium: 4.2 mmol/L (ref 3.5–5.2)
Sodium: 143 mmol/L (ref 134–144)
Total Protein: 7 g/dL (ref 6.0–8.5)
eGFR: 96 mL/min/{1.73_m2} (ref >59)

## 2021-05-08 LAB — LIPID PANEL WITH LDL/HDL RATIO
Cholesterol, Total: 181 mg/dL (ref 100–199)
HDL: 76 mg/dL (ref >39)
LDL Chol Calc (NIH): 98 mg/dL (ref 0–99)
LDL/HDL Ratio: 1.3 ratio (ref 0.0–3.2)
Triglycerides: 31 mg/dL (ref 0–149)
VLDL Cholesterol Cal: 7 mg/dL (ref 5–40)

## 2021-05-08 LAB — TSH+FREE T4
Free T4: 1.25 ng/dL (ref 0.82–1.77)
TSH: 1.01 u[IU]/mL (ref 0.450–4.500)

## 2021-05-08 LAB — VITAMIN D 25 HYDROXY (VIT D DEFICIENCY, FRACTURES): Vit D, 25-Hydroxy: 26 ng/mL — ABNORMAL LOW (ref 30.0–100.0)

## 2021-05-22 ENCOUNTER — Telehealth: Payer: Self-pay

## 2021-05-22 NOTE — Telephone Encounter (Signed)
-----   Message from Mylinda Latina, PA-C sent at 05/13/2021  8:36 AM EDT ----- ?Please let her know that her vitamin D is still low and should supplement OTC. Her liver enzymes are also slightly elevated and ask if she has been taking tylenol or drinking alcohol as these can raise levels. We will need to monitor ?

## 2021-05-22 NOTE — Telephone Encounter (Signed)
Spoke with patient regarding lab results on 05/22/2021. ?

## 2021-08-12 DIAGNOSIS — J029 Acute pharyngitis, unspecified: Secondary | ICD-10-CM | POA: Diagnosis not present

## 2021-12-17 ENCOUNTER — Other Ambulatory Visit: Payer: Self-pay | Admitting: Physician Assistant

## 2021-12-17 ENCOUNTER — Encounter: Payer: Self-pay | Admitting: Physician Assistant

## 2021-12-17 ENCOUNTER — Ambulatory Visit (INDEPENDENT_AMBULATORY_CARE_PROVIDER_SITE_OTHER): Payer: BC Managed Care – PPO | Admitting: Physician Assistant

## 2021-12-17 VITALS — BP 118/72 | HR 65 | Temp 98.4°F | Resp 16 | Ht 60.0 in | Wt 129.0 lb

## 2021-12-17 DIAGNOSIS — E538 Deficiency of other specified B group vitamins: Secondary | ICD-10-CM

## 2021-12-17 DIAGNOSIS — R5383 Other fatigue: Secondary | ICD-10-CM

## 2021-12-17 DIAGNOSIS — R079 Chest pain, unspecified: Secondary | ICD-10-CM | POA: Diagnosis not present

## 2021-12-17 DIAGNOSIS — E559 Vitamin D deficiency, unspecified: Secondary | ICD-10-CM

## 2021-12-17 DIAGNOSIS — R7989 Other specified abnormal findings of blood chemistry: Secondary | ICD-10-CM | POA: Diagnosis not present

## 2021-12-17 DIAGNOSIS — R001 Bradycardia, unspecified: Secondary | ICD-10-CM

## 2021-12-17 DIAGNOSIS — E782 Mixed hyperlipidemia: Secondary | ICD-10-CM

## 2021-12-17 NOTE — Progress Notes (Signed)
[facility], [place] [address]  Internal MEDICINE  Office Visit Note  Patient Name: Kathleen Washington  [id number]  [id number]  Date of Service: 12/17/2021  Chief Complaint  Patient presents with   Fatigue   Dizziness    Had some vertigo symptoms over the weekend, some nausea, but subsided after Sunday     HPI Pt is here for a sick visit. Her daughter is with her -About a month ago had a little chest pain and since this episode has been feeling weaker and fatigued. CP happened when she was sleeping and lasted the whole night. It was in middle/right side of chest and went around to the back. It resolved about 2 hours into the morning and no further instances have occurred. She did not feel SOB when this happened and did not seek any help. Discussed going to ED if this happens again. -She has continued to feel fatigued the past few weeks -This past weekend was nauseous and did get a little dizzy but this resolved on Tuesday -No repeat episodes of CP and denies palpitations or SOB -Sleeping well, about 5-6 hours which is normal for her. Did discuss the need to increase the time to at least 7 hours of sleep, but this is not a new thing for her to explain sudden fatigue recently -She is taking OTC vitamin D supplement, but no other meds -Her BP is stable -Will order EKG in office to check for abnormal heart rhythm and since she is borderline bradycardic on exam  Current Medication:  Outpatient Encounter Medications as of 12/17/2021  Medication Sig   ibuprofen (ADVIL) 600 MG tablet Take 1 tablet (600 mg total) by mouth every 8 (eight) hours as needed.   No facility-administered encounter medications on file as of 12/17/2021.      Medical History: History reviewed. No pertinent past medical history.   Vital Signs: BP 118/72   Pulse 65   Temp 98.4 F (36.9 C)   Resp 16   Ht 5' (1.524 m)   Wt 129 lb (58.5 kg)   SpO2 99%   BMI 25.19 kg/m     Review of Systems  Constitutional:  Positive for fatigue. Negative for fever.  HENT:  Negative for congestion, mouth sores and postnasal drip.   Respiratory:  Negative for cough.   Cardiovascular:  Positive for chest pain. Negative for palpitations.  Genitourinary:  Negative for flank pain.  Neurological:  Positive for dizziness. Negative for light-headedness.  Psychiatric/Behavioral: Negative.      Physical Exam Vitals and nursing note reviewed.  Constitutional:      General: She is not in acute distress.    Appearance: Normal appearance. She is well-developed. She is not diaphoretic.  HENT:     Head: Normocephalic and atraumatic.     Mouth/Throat:     Pharynx: No oropharyngeal exudate.  Eyes:     Pupils: Pupils are equal, round, and reactive to light.  Neck:     Thyroid: No thyromegaly.     Vascular: No JVD.     Trachea: No tracheal deviation.  Cardiovascular:     Rate and Rhythm: Regular rhythm. Bradycardia present.     Heart sounds: Normal heart sounds. No murmur heard.    No friction rub. No gallop.  Pulmonary:     Effort: Pulmonary effort is normal. No respiratory distress.     Breath sounds: No wheezing or rales.  Chest:     Chest wall: No tenderness.  Breasts:    Right: Normal. No mass.     Left: Normal. No mass.  Abdominal:     General: Bowel sounds are normal.     Palpations: Abdomen is soft.  Musculoskeletal:        General: Normal range of motion.     Cervical back: Normal range of motion and neck supple.  Lymphadenopathy:     Cervical: No cervical adenopathy.  Skin:    General: Skin is warm and dry.  Neurological:     Mental Status: She is alert and oriented to person, place, and time.     Cranial Nerves: No cranial nerve deficit.  Psychiatric:        Behavior: Behavior normal.        Thought Content: Thought content normal.        Judgment: Judgment normal.       Assessment/Plan: 1. Bradycardia HR low on exam and on EKG around 58. She  denies current dizziness or lightheadedness, but could contribute to fatigue. Will order echo to evaluate further - ECHOCARDIOGRAM COMPLETE; Future  2. Chest pain, unspecified type No current CP however fatigue began after episode of CP 1 month ago. EKG ordered and shows Sinus bradycardia. Will order echo. Advised to go to ED if any new or worsening symptoms including any recurrence of CP. - EKG 12-Lead - ECHOCARDIOGRAM COMPLETE; Future  3. Vitamin D deficiency - VITAMIN D 25 Hydroxy (Vit-D Deficiency, Fractures)  4. Mixed hyperlipidemia - Lipid Panel With LDL/HDL Ratio  5. Abnormal thyroid blood test - TSH + free T4  6. B12 deficiency - B12 and Folate Panel  7. Other fatigue Will order blood work to evaluate causes of fatigue in addition to further workup of bradycardia with echo. If labs normal and symptoms persist may need to see cardiology. - CBC w/Diff/Platelet - Comprehensive metabolic panel - Fe+TIBC+Fer - B12 and Folate Panel - Lipid Panel With LDL/HDL Ratio - ECHOCARDIOGRAM COMPLETE; Future   General Counseling: Ashlley verbalizes understanding of the findings of todays visit and agrees with plan of treatment. I have discussed any further diagnostic evaluation that may be needed or ordered today. We also reviewed her medications today. she has been encouraged to call the office with any questions or concerns that should arise related to todays visit.    Counseling:    Orders Placed This Encounter  Procedures   CBC w/Diff/Platelet   Comprehensive metabolic panel   TSH + free T4   Fe+TIBC+Fer   B12 and Folate Panel   Lipid Panel With LDL/HDL Ratio   VITAMIN D 25 Hydroxy (Vit-D Deficiency, Fractures)   EKG 12-Lead   ECHOCARDIOGRAM COMPLETE    No orders of the defined types were placed in this encounter.   Time spent:35 Minutes

## 2021-12-18 ENCOUNTER — Other Ambulatory Visit: Payer: Self-pay | Admitting: Physician Assistant

## 2021-12-18 ENCOUNTER — Telehealth: Payer: Self-pay

## 2021-12-18 DIAGNOSIS — E559 Vitamin D deficiency, unspecified: Secondary | ICD-10-CM

## 2021-12-18 LAB — COMPREHENSIVE METABOLIC PANEL
ALT: 40 IU/L — ABNORMAL HIGH (ref 0–32)
AST: 34 IU/L (ref 0–40)
Albumin/Globulin Ratio: 1.8 (ref 1.2–2.2)
Albumin: 4.6 g/dL (ref 3.8–4.9)
Alkaline Phosphatase: 100 IU/L (ref 44–121)
BUN/Creatinine Ratio: 23 (ref 9–23)
BUN: 16 mg/dL (ref 6–24)
Bilirubin Total: 0.6 mg/dL (ref 0.0–1.2)
CO2: 23 mmol/L (ref 20–29)
Calcium: 9.9 mg/dL (ref 8.7–10.2)
Chloride: 103 mmol/L (ref 96–106)
Creatinine, Ser: 0.71 mg/dL (ref 0.57–1.00)
Globulin, Total: 2.6 g/dL (ref 1.5–4.5)
Glucose: 92 mg/dL (ref 70–99)
Potassium: 4.3 mmol/L (ref 3.5–5.2)
Sodium: 141 mmol/L (ref 134–144)
Total Protein: 7.2 g/dL (ref 6.0–8.5)
eGFR: 100 mL/min/{1.73_m2} (ref >59)

## 2021-12-18 LAB — CBC WITH DIFFERENTIAL/PLATELET
Basophils Absolute: 0 10*3/uL (ref 0.0–0.2)
Basos: 1 %
EOS (ABSOLUTE): 0.1 10*3/uL (ref 0.0–0.4)
Eos: 1 %
Hematocrit: 41.5 % (ref 34.0–46.6)
Hemoglobin: 13.5 g/dL (ref 11.1–15.9)
Immature Grans (Abs): 0 10*3/uL (ref 0.0–0.1)
Immature Granulocytes: 0 %
Lymphocytes Absolute: 2.2 10*3/uL (ref 0.7–3.1)
Lymphs: 40 %
MCH: 30.1 pg (ref 26.6–33.0)
MCHC: 32.5 g/dL (ref 31.5–35.7)
MCV: 93 fL (ref 79–97)
Monocytes Absolute: 0.3 10*3/uL (ref 0.1–0.9)
Monocytes: 5 %
Neutrophils Absolute: 2.8 10*3/uL (ref 1.4–7.0)
Neutrophils: 53 %
Platelets: 261 10*3/uL (ref 150–450)
RBC: 4.48 x10E6/uL (ref 3.77–5.28)
RDW: 12.3 % (ref 11.7–15.4)
WBC: 5.4 10*3/uL (ref 3.4–10.8)

## 2021-12-18 LAB — IRON,TIBC AND FERRITIN PANEL
Ferritin: 100 ng/mL (ref 15–150)
Iron Saturation: 47 % (ref 15–55)
Iron: 149 ug/dL (ref 27–159)
Total Iron Binding Capacity: 319 ug/dL (ref 250–450)
UIBC: 170 ug/dL (ref 131–425)

## 2021-12-18 LAB — TSH+FREE T4
Free T4: 1.05 ng/dL (ref 0.82–1.77)
TSH: 0.84 u[IU]/mL (ref 0.450–4.500)

## 2021-12-18 LAB — LIPID PANEL WITH LDL/HDL RATIO
Cholesterol, Total: 204 mg/dL — ABNORMAL HIGH (ref 100–199)
HDL: 90 mg/dL (ref >39)
LDL Chol Calc (NIH): 105 mg/dL — ABNORMAL HIGH (ref 0–99)
LDL/HDL Ratio: 1.2 ratio (ref 0.0–3.2)
Triglycerides: 46 mg/dL (ref 0–149)
VLDL Cholesterol Cal: 9 mg/dL (ref 5–40)

## 2021-12-18 LAB — VITAMIN D 25 HYDROXY (VIT D DEFICIENCY, FRACTURES): Vit D, 25-Hydroxy: 24.6 ng/mL — ABNORMAL LOW (ref 30.0–100.0)

## 2021-12-18 LAB — B12 AND FOLATE PANEL
Folate: >20 ng/mL (ref >3.0)
Vitamin B-12: 593 pg/mL (ref 232–1245)

## 2021-12-18 MED ORDER — ERGOCALCIFEROL 1.25 MG (50000 UT) PO CAPS: ORAL_CAPSULE | ORAL | 3 refills | Status: DC

## 2021-12-18 NOTE — Telephone Encounter (Signed)
Spoke with patient's sister regarding lab results and notified her that Echo was sent to pharmacy and should be taken once weekly.

## 2021-12-18 NOTE — Telephone Encounter (Signed)
-----   Message from Mylinda Latina, PA-C sent at 12/18/2021 12:00 PM EST ----- Please let her know that overall her labs looked pretty good. One of her liver enzymes is still slightly elevated but improved from previously. Her cholesterol did go up slightly and should work on diet for this. Her vitamin D is still low despite her supplement and I will go ahead and send drisdol to be taken once per week

## 2021-12-25 ENCOUNTER — Ambulatory Visit
Admission: RE | Admit: 2021-12-25 | Discharge: 2021-12-25 | Disposition: A | Discharge status: Home / Self Care | Payer: BC Managed Care – PPO | Source: Ambulatory Visit | Attending: Physician Assistant | Admitting: Physician Assistant

## 2021-12-25 DIAGNOSIS — R079 Chest pain, unspecified: Secondary | ICD-10-CM | POA: Insufficient documentation

## 2021-12-25 DIAGNOSIS — R001 Bradycardia, unspecified: Secondary | ICD-10-CM | POA: Diagnosis not present

## 2021-12-25 DIAGNOSIS — R42 Dizziness and giddiness: Secondary | ICD-10-CM | POA: Diagnosis not present

## 2021-12-25 DIAGNOSIS — R5383 Other fatigue: Secondary | ICD-10-CM | POA: Insufficient documentation

## 2021-12-25 LAB — ECHOCARDIOGRAM COMPLETE
AR max vel: 2.68 cm2
AV Area VTI: 3.21 cm2
AV Area mean vel: 2.51 cm2
AV Mean grad: 4 mmHg
AV Peak grad: 6.6 mmHg
Ao pk vel: 1.28 m/s
Area-P 1/2: 4.17 cm2
S' Lateral: 2.4 cm

## 2021-12-25 NOTE — Progress Notes (Signed)
*  PRELIMINARY RESULTS* Echocardiogram 2D Echocardiogram has been performed.  Sherrie Sport 12/25/2021, 10:49 AM

## 2022-01-07 ENCOUNTER — Ambulatory Visit (INDEPENDENT_AMBULATORY_CARE_PROVIDER_SITE_OTHER): Payer: BC Managed Care – PPO | Admitting: Physician Assistant

## 2022-01-07 ENCOUNTER — Encounter: Payer: Self-pay | Admitting: Physician Assistant

## 2022-01-07 ENCOUNTER — Telehealth: Payer: Self-pay | Admitting: Physician Assistant

## 2022-01-07 VITALS — BP 118/79 | HR 64 | Temp 97.9°F | Resp 16 | Ht 60.0 in | Wt 132.0 lb

## 2022-01-07 DIAGNOSIS — I517 Cardiomegaly: Secondary | ICD-10-CM

## 2022-01-07 DIAGNOSIS — G4710 Hypersomnia, unspecified: Secondary | ICD-10-CM | POA: Diagnosis not present

## 2022-01-07 NOTE — Telephone Encounter (Signed)
Mailed today's payment receipt to patient-Kathleen Washington

## 2022-01-07 NOTE — Telephone Encounter (Signed)
Awaiting 01/07/22 office notes for SS order-Toni

## 2022-01-07 NOTE — Progress Notes (Signed)
[facility] [place], [place] [address]  Internal MEDICINE  Office Visit Note  Patient Name: Kathleen Washington  [id number]  [id number]  Date of Service: 01/07/2022  Chief Complaint  Patient presents with   Follow-up    Review echo    HPI Pt is here for routine follow up to review echo ECHO 12/25/21 IMPRESSIONS   1. Left ventricular ejection fraction, by estimation, is 60 to 65%. The  left ventricle has normal function. The left ventricle has no regional  wall motion abnormalities. Left ventricular diastolic parameters were  normal.   2. Right ventricular systolic function is normal. The right ventricular  size is normal.   3. Left atrial size was moderately dilated.   4. The mitral valve is normal in structure. Mild mitral valve  regurgitation. No evidence of mitral stenosis.   5. The aortic valve is normal in structure. Aortic valve regurgitation is  not visualized. No aortic stenosis is present.   6. The inferior vena cava is normal in size with greater than 50%  respiratory variability, suggesting right atrial pressure of 3 mmHg.   -Due to LA dilation and patient complaining of daily fatigue will move forward with sleep evaluation -Feels tired everyday and never wakes feeling rested. Unsure if any snoring or pauses in breathing. -She does report she is feeling better than last visit and has not had any dizziness. Echo was ordered to bradycardia, dizziness, and episode of CP during her sleep, but fortunately this is now better  EPWORTH SLEEPINESS SCALE:  Scale:  (0)= no chance of dozing; (1)= slight chance of dozing; (2)= moderate chance of dozing; (3)= high chance of dozing  Chance  Situtation    Sitting and reading: 3    Watching TV: 3    Sitting Inactive in public: 2    As a passenger in car: 0      Lying down to rest: 3    Sitting and talking: 2    Sitting quielty after lunch: 2    In a car, stopped in traffic: 0   TOTAL  SCORE:   15 out of 24   Current Medication: Outpatient Encounter Medications as of 01/07/2022  Medication Sig   ergocalciferol (DRISDOL) 1.25 MG (50000 UT) capsule Take one cap q week   ibuprofen (ADVIL) 600 MG tablet Take 1 tablet (600 mg total) by mouth every 8 (eight) hours as needed.   No facility-administered encounter medications on file as of 01/07/2022.    Surgical History: Past Surgical History:  Procedure Laterality Date   BREAST BIOPSY Left 05/07/2013   neg   COLONOSCOPY WITH PROPOFOL N/A 12/11/2018   Procedure: COLONOSCOPY WITH PROPOFOL;  Surgeon: Jonathon Bellows, MD;  Location: [facility] ENDOSCOPY;  Service: Gastroenterology;  Laterality: N/A;    Medical History: History reviewed. No pertinent past medical history.  Family History: History reviewed. No pertinent family history.  Social History   Socioeconomic History   Marital status: Divorced    Spouse name: Not on file   Number of children: Not on file   Years of education: Not on file   Highest education level: Not on file  Occupational History   Not on file  Tobacco Use   Smoking status: Never   Smokeless tobacco: Never  Substance and Sexual Activity   Alcohol use: No   Drug use: No   Sexual activity: Not on file  Other Topics Concern   Not on file  Social History Narrative  Not on file   Social Determinants of Health   Financial Resource Strain: Not on file  Food Insecurity: Not on file  Transportation Needs: Not on file  Physical Activity: Not on file  Stress: Not on file  Social Connections: Not on file  Intimate Partner Violence: Not on file      Review of Systems  Constitutional:  Positive for fatigue. Negative for chills and unexpected weight change.  HENT:  Negative for congestion, postnasal drip, rhinorrhea, sneezing and sore throat.   Eyes:  Negative for redness.  Respiratory:  Negative for cough, chest tightness and shortness of breath.   Cardiovascular:  Negative for chest pain and  palpitations.  Gastrointestinal:  Negative for abdominal pain, constipation, diarrhea, nausea and vomiting.  Genitourinary:  Negative for dysuria and frequency.  Musculoskeletal:  Negative for arthralgias, back pain, joint swelling and neck pain.  Skin:  Negative for rash.  Neurological: Negative.  Negative for tremors and numbness.  Hematological:  Negative for adenopathy. Does not bruise/bleed easily.  Psychiatric/Behavioral:  Positive for sleep disturbance. Negative for behavioral problems (Depression) and suicidal ideas. The patient is not nervous/anxious.     Vital Signs: BP 118/79   Pulse 64   Temp 97.9 F (36.6 C)   Resp 16   Ht 5' (1.524 m)   Wt 132 lb (59.9 kg)   SpO2 99%   BMI 25.78 kg/m    Physical Exam Vitals and nursing note reviewed.  Constitutional:      General: She is not in acute distress.    Appearance: Normal appearance. She is well-developed. She is not diaphoretic.  HENT:     Head: Normocephalic and atraumatic.     Mouth/Throat:     Pharynx: No oropharyngeal exudate.  Eyes:     Pupils: Pupils are equal, round, and reactive to light.  Neck:     Thyroid: No thyromegaly.     Vascular: No JVD.     Trachea: No tracheal deviation.  Cardiovascular:     Rate and Rhythm: Normal rate and regular rhythm.     Heart sounds: Normal heart sounds. No murmur heard.    No friction rub. No gallop.  Pulmonary:     Effort: Pulmonary effort is normal. No respiratory distress.     Breath sounds: No wheezing or rales.  Chest:     Chest wall: No tenderness.  Breasts:    Right: Normal. No mass.     Left: Normal. No mass.  Abdominal:     General: Bowel sounds are normal.     Palpations: Abdomen is soft.  Musculoskeletal:        General: Normal range of motion.     Cervical back: Normal range of motion and neck supple.  Lymphadenopathy:     Cervical: No cervical adenopathy.  Skin:    General: Skin is warm and dry.  Neurological:     Mental Status: She is alert  and oriented to person, place, and time.     Cranial Nerves: No cranial nerve deficit.  Psychiatric:        Behavior: Behavior normal.        Thought Content: Thought content normal.        Judgment: Judgment normal.        Assessment/Plan: 1. Hypersomnia Will order HST due to daytime sleepiness with elevated ESS of 15 and now additional LA dilation on echo after recent episode of CP during sleep and bradycardia. - Home sleep test  2. Left atrial  dilation Will order HST to evaluate for OSA due to this finding plus sleepiness - Home sleep test   General Counseling: Kelsa verbalizes understanding of the findings of todays visit and agrees with plan of treatment. I have discussed any further diagnostic evaluation that may be needed or ordered today. We also reviewed her medications today. she has been encouraged to call the office with any questions or concerns that should arise related to todays visit.    Orders Placed This Encounter  Procedures   Home sleep test    No orders of the defined types were placed in this encounter.   This patient was seen by Drema Dallas, PA-C in collaboration with Dr. Clayborn Bigness as a part of collaborative care agreement.   Total time spent:30 Minutes Time spent includes review of chart, medications, test results, and follow up plan with the patient.      Dr Lavera Guise Internal medicine

## 2022-01-07 NOTE — Telephone Encounter (Signed)
SS order placed in [facility] folder-Toni 

## 2022-01-15 ENCOUNTER — Telehealth: Payer: Self-pay | Admitting: Physician Assistant

## 2022-01-15 NOTE — Telephone Encounter (Signed)
SS scheduled for 02/16/2022-Toni

## 2022-02-23 ENCOUNTER — Encounter (INDEPENDENT_AMBULATORY_CARE_PROVIDER_SITE_OTHER): Payer: BC Managed Care – PPO | Admitting: Internal Medicine

## 2022-02-23 DIAGNOSIS — G4719 Other hypersomnia: Secondary | ICD-10-CM

## 2022-02-24 ENCOUNTER — Encounter (INDEPENDENT_AMBULATORY_CARE_PROVIDER_SITE_OTHER): Payer: BC Managed Care – PPO | Admitting: Internal Medicine

## 2022-02-24 DIAGNOSIS — G4719 Other hypersomnia: Secondary | ICD-10-CM

## 2022-03-11 NOTE — Procedures (Signed)
[place]  Portable Polysomnogram Report Part 1 Phone: [phone] Fax: [phone]  Patient Name: Kathleen Washington, Kathleen Washington Recording Device: Glee Arvin  D.O.B.: March 29, 1965 Acquisition Number: [id number]  Referring Physician: Ander Purpura McDonough,PA-C  Acquisition Date: 02/24/2022   History: The patient is a 57 years old female who was referred for re-evaluation of possible sleep apnea.  Medical History: left atrial dilation.  Medications: Drisdol.  PROCEDURE  The unattended portable polysomnogram was conducted on the night of 02/24/2022.  The following parameters were monitored: Nasal and oral airflow, and body position. Additionally, thoracic and abdominal movements were recorded by inductance plethysmography. Oxygen saturation (SpO2) and heart rate (ECG) was monitored using a pulse Oximeter.  The tracing was scored using 30 second epochs. Hypopneas were scored per AASM definition VIIID1.B (4% desaturation).   Description: The total recording time was 252.7 minutes. Sleep parameters are not recorded.  Respiratory monitoring demonstrated significant snoring across the night in all positions. There were no apneas or hypopneas during the recording. The mean oxygen saturation was 94 %. The total duration of oxygen < 90% was 0.0 minutes and <80% was 0.0 minutes.   Cardiac monitoring- The average heart rate during the recording was 64.0 bpm.  Impression: This repeat overnight portable polysomnogram did not demonstrate obstructive sleep apnea with no respiratory events recording. The lowest desaturation was to 91 %.   Recommendations:     Negative home studies do not necessarily rule out significant sleep apnea or other sleep disorders. If there is clinical suspicion for sleep apnea or another sleep disorder a fully attended, in laboratory study is recommended.  Allyne Gee, MD [facility] Diplomate ABMS Pulmonary Critical Care and Sleep Medicine Electronically reviewed  and digitally signed   [place]  Portable Polysomnogram Report Part 2 Phone: [phone] Fax: [phone]   Study Date: 02/24/2022  Patient Name: Kathleen Washington, Kathleen Washington Recording Device: Glee Arvin  Sex: F Height: 60.0 in.  D.O.B.: 1965-05-11 Weight: 132.0 lbs.  Age: 56 years B.M.I: 25.8 lb/in2   Times and Durations  Lights off clock time:  7:44:22 PM Total Recording Time (TRT): 252.7 minutes  Lights on clock time: 11:57:04 PM Time In Bed (TIB): 252.7 minutes   Summary  AHI 0.0 OAI 0.0 CAI 0.0 Lowest Desat 91  AHI is the number of apneas and hypopneas per hour. OAI is the number of obstructive apneas per hour. CAI is the number of central apneas per hour. Lowest Desat is the lowest blood oxygen level that lasted at least 2 seconds.  RESPIRATORY EVENTS   Index (#/hour) Total # of Events Mean duration  (sec) Max duration  (sec) # of Events by Position       Supine Prone Left Right Up  Central Apneas 0.0 0 0.0 0.0 0              Obstructive Apneas 0.0 0 0.0 0.0 0              Mixed Apneas 0.0 0 0.0 0.0 0              Hypopneas 0.0 0 0.0 0.0 0              Apneas + Hypopneas 0.0 0 0.0 0.0 0              Total 0.0 0 0.0 0.0 0              Time in Position 252.7  AHI in Position 0.0                Oximetry Summary   Dur. (min) % TIB  <90 % 0.0 0.0  <85 % 0.0 0.0  <80 % 0.0 0.0  <70 % 0.0 0.0  Total Dur (min) < 89 0.0 min  Average (%) 94  Total # of Desats 4  Desat Index (#/hour) 1.0  Desat Max (%) 4  Desat Max dur (sec) 50.0  Lowest SpO2 % during sleep 91  Duration of Min SpO2 (sec) 6    Heart Rate Stats  Mean HR during sleep (BPM)  Highest HR during sleep 85  (BPM)  Highest HR during TIB  85 (BPM)    Snoring Summary  Total Snoring Episodes 13  Total Duration with Snoring 1.8 minutes  Mean Duration of Snoring 8.1 seconds  Percentage of Snoring 0.7 %

## 2022-03-11 NOTE — Procedures (Signed)
[place]  Portable Polysomnogram Report Part 1 Phone: [phone] Fax: [phone]  Patient Name: Kathleen Washington, Kathleen Washington Recording Device: Glee Arvin  D.O.B.: [birth year] Acquisition Number: [id number]  Referring Physician: Ander Purpura McDonough,PA-C  Acquisition Date: 02/23/2022   History: The patient is a 57 years old female who was referred for evaluation of possible sleep apnea.  Medical History: left atrial dilation.  Medications: Drisdol.  PROCEDURE  The unattended portable polysomnogram was conducted on the night of 02/23/2022.  The following parameters were monitored: Nasal and oral airflow, and body position. Additionally, thoracic and abdominal movements were recorded by inductance plethysmography. Oxygen saturation (SpO2) and heart rate (ECG) was monitored using a pulse Oximeter.  The tracing was scored using 30 second epochs. Hypopneas were scored per AASM definition VIIID1.B (4% desaturation).    Description: The total recording time was 486.7 minutes. Sleep parameters are not recorded.  Respiratory monitoring demonstrated significant snoring across the night. All of the recording was in the supine position. There were a total of 18 apneas and hypopneas for a Respiratory Event Index of 2.2 apneas and hypopneas per hour of recording. The average duration of the respiratory events was 37.1 seconds with a maximum duration of 97.5 seconds. The respiratory events were associated with peripheral oxygen desaturations on the average to 95 %. The lowest oxygen desaturation associated with a respiratory event was 89 %. Additionally, the mean oxygen saturation was 95 %. The total duration of oxygen < 90% was 0.0 minutes and <80% was 0.0 minutes.   Cardiac monitoring- The average heart rate during the recording was 65.8 bpm.  Impression: This routine overnight portable polysomnogram did not demonstrate obstructive sleep apnea. Overall the Respiratory Event Index was  2.2 apneas and hypopneas per hour of recording with the lowest desaturation to 89 %. All of the recording was in the supine position.  Recommendations:     A negative home study does not necessarily rule out significant sleep apnea and a repeat study is recommended.  Allyne Gee, MD [facility] Diploate ABMS Pulmonary Critical Care and Sleep Medicine Electronically reviewed and digitally signed   [place]  Portable Polysomnogram Report Part 2 Phone: [phone] Fax: [phone]   Study Date: 02/23/2022  Patient Name: Kathleen Washington, Kathleen Washington Recording Device: Glee Arvin  Sex: F Height: 60.0 in.  D.O.B.: April 29, 1965 Weight: 132.0 lbs.  Age: 43 years B.M.I: 25.8 lb/in2   Times and Durations  Lights off clock time:  8:21:19 PM Total Recording Time (TRT): 486.7 minutes  Lights on clock time: 4:28:01 AM Time In Bed (TIB): 486.7 minutes   Summary  AHI 2.2 OAI 0.4 CAI 0.0 Lowest Desat 89  AHI is the number of apneas and hypopneas per hour. OAI is the number of obstructive apneas per hour. CAI is the number of central apneas per hour. Lowest Desat is the lowest blood oxygen level that lasted at least 2 seconds.  RESPIRATORY EVENTS   Index (#/hour) Total # of Events Mean duration  (sec) Max duration  (sec) # of Events by Position       Supine Prone Left Right Up  Central Apneas 0.0 0 0.0 0.0 0              Obstructive Apneas 0.4 3 31.0 52.0 3              Mixed Apneas 0.0 0 0.0 0.0 0              Hypopneas 1.8  15 38.3 97.5 15              Apneas + Hypopneas 2.2 18 37.1 97.5 18              Total 2.2 18 37.1 97.5 18              Time in Position 486.7              AHI in Position 2.2                Oximetry Summary   Dur. (min) % TIB  <90 % 0.0 0.0  <85 % 0.0 0.0  <80 % 0.0 0.0  <70 % 0.0 0.0  Total Dur (min) < 89 0.0 min  Average (%) 95  Total # of Desats 26  Desat Index (#/hour) 3.2  Desat Max (%) 8  Desat Max dur (sec) 49.0  Lowest SpO2 % during sleep 89  Duration  of Min SpO2 (sec) 2    Heart Rate Stats  Mean HR during sleep (BPM)  Highest HR during sleep 90  (BPM)  Highest HR during TIB  90 (BPM)    Snoring Summary  Total Snoring Episodes 0  Total Duration with Snoring    minutes  Mean Duration of Snoring    seconds  Percentage of Snoring    %

## 2022-05-03 ENCOUNTER — Encounter: Payer: Self-pay | Admitting: Physician Assistant

## 2022-05-03 ENCOUNTER — Ambulatory Visit (INDEPENDENT_AMBULATORY_CARE_PROVIDER_SITE_OTHER): Payer: BC Managed Care – PPO | Admitting: Physician Assistant

## 2022-05-03 VITALS — BP 123/75 | HR 86 | Temp 98.6°F | Resp 16 | Ht 60.0 in | Wt 136.2 lb

## 2022-05-03 DIAGNOSIS — Z0001 Encounter for general adult medical examination with abnormal findings: Secondary | ICD-10-CM | POA: Diagnosis not present

## 2022-05-03 DIAGNOSIS — E559 Vitamin D deficiency, unspecified: Secondary | ICD-10-CM | POA: Diagnosis not present

## 2022-05-03 DIAGNOSIS — Z1231 Encounter for screening mammogram for malignant neoplasm of breast: Secondary | ICD-10-CM

## 2022-05-03 DIAGNOSIS — R300 Dysuria: Secondary | ICD-10-CM | POA: Diagnosis not present

## 2022-05-03 DIAGNOSIS — N9089 Other specified noninflammatory disorders of vulva and perineum: Secondary | ICD-10-CM | POA: Diagnosis not present

## 2022-05-03 DIAGNOSIS — N842 Polyp of vagina: Secondary | ICD-10-CM

## 2022-05-03 MED ORDER — ERGOCALCIFEROL 1.25 MG (50000 UT) PO CAPS: ORAL_CAPSULE | ORAL | 3 refills | Status: DC

## 2022-05-03 NOTE — Progress Notes (Signed)
[facility] [address] [place], [place] [address]  Internal MEDICINE  Office Visit Note  Patient Name: Kathleen Washington  [id number]  [id number]  Date of Service: 05/14/2022  Chief Complaint  Patient presents with   Annual Exam     HPI Pt is here for routine health maintenance examination -sleeping well -going for walks for exercise -continues to work M-S -still taking vitamin D supplement -BP stable -Hst negative for OSA -no vaginal itching or bleeding, a little pressure. Noticed a bump sticking out along vagina. This was a new finding when she was washing the other day. No painful, but bothersome and would like referral to GYN to check this  Current Medication: Outpatient Encounter Medications as of 05/03/2022  Medication Sig   ibuprofen (ADVIL) 600 MG tablet Take 1 tablet (600 mg total) by mouth every 8 (eight) hours as needed.   [DISCONTINUED] ergocalciferol (DRISDOL) 1.25 MG (50000 UT) capsule Take one cap q week   ergocalciferol (DRISDOL) 1.25 MG (50000 UT) capsule Take one cap q week   No facility-administered encounter medications on file as of 05/03/2022.    Surgical History: Past Surgical History:  Procedure Laterality Date   BREAST BIOPSY Left 05/07/2013   neg   COLONOSCOPY WITH PROPOFOL N/A 12/11/2018   Procedure: COLONOSCOPY WITH PROPOFOL;  Surgeon: Wyline Mood, MD;  Location: [facility] ENDOSCOPY;  Service: Gastroenterology;  Laterality: N/A;    Medical History: History reviewed. No pertinent past medical history.  Family History: History reviewed. No pertinent family history.    Review of Systems  Constitutional:  Negative for chills, fatigue and unexpected weight change.  HENT:  Negative for congestion, postnasal drip, rhinorrhea, sneezing and sore throat.   Eyes:  Negative for redness.  Respiratory:  Negative for cough, chest tightness and shortness of breath.   Cardiovascular:  Negative for chest pain and palpitations.   Gastrointestinal:  Negative for abdominal pain, constipation, diarrhea, nausea and vomiting.  Genitourinary:  Negative for dysuria and frequency.       Vaginal bump  Musculoskeletal:  Negative for arthralgias, back pain, joint swelling and neck pain.  Skin:  Negative for rash.  Neurological: Negative.  Negative for tremors and numbness.  Hematological:  Negative for adenopathy. Does not bruise/bleed easily.  Psychiatric/Behavioral:  Negative for behavioral problems (Depression), sleep disturbance and suicidal ideas. The patient is not nervous/anxious.      Vital Signs: BP 123/75   Pulse 86   Temp 98.6 F (37 C)   Resp 16   Ht 5' (1.524 m)   Wt 136 lb 3.2 oz (61.8 kg)   SpO2 98%   BMI 26.60 kg/m    Physical Exam Vitals and nursing note reviewed.  Constitutional:      General: She is not in acute distress.    Appearance: Normal appearance. She is well-developed. She is not diaphoretic.  HENT:     Head: Normocephalic and atraumatic.     Mouth/Throat:     Pharynx: No oropharyngeal exudate.  Eyes:     Pupils: Pupils are equal, round, and reactive to light.  Neck:     Thyroid: No thyromegaly.     Vascular: No JVD.     Trachea: No tracheal deviation.  Cardiovascular:     Rate and Rhythm: Normal rate and regular rhythm.     Heart sounds: Normal heart sounds. No murmur heard.    No friction rub. No gallop.  Pulmonary:     Effort: Pulmonary effort is normal. No respiratory distress.  Breath sounds: No wheezing or rales.  Chest:     Chest wall: No tenderness.  Breasts:    Right: Normal. No mass.     Left: Normal. No mass.  Abdominal:     General: Bowel sounds are normal.     Palpations: Abdomen is soft.     Tenderness: There is no abdominal tenderness.  Genitourinary:    Comments: Vulvar lesion, possible polyp/cyst Musculoskeletal:        General: Normal range of motion.     Cervical back: Normal range of motion and neck supple.  Lymphadenopathy:     Cervical:  No cervical adenopathy.  Skin:    General: Skin is warm and dry.  Neurological:     Mental Status: She is alert and oriented to person, place, and time.     Cranial Nerves: No cranial nerve deficit.  Psychiatric:        Behavior: Behavior normal.        Thought Content: Thought content normal.        Judgment: Judgment normal.      LABS: Recent Results (from the past 2160 hour(s))  UA/M w/rflx Culture, Routine     Status: None   Collection Time: 05/03/22  4:10 PM   Specimen: Urine   Urine  Result Value Ref Range   Specific Gravity, UA 1.005 1.005 - 1.030   pH, UA 5.5 5.0 - 7.5   Color, UA Yellow Yellow   Appearance Ur Clear Clear   Leukocytes,UA Negative Negative   Protein,UA Negative Negative/Trace   Glucose, UA Negative Negative   Ketones, UA Negative Negative   RBC, UA Negative Negative   Bilirubin, UA Negative Negative   Urobilinogen, Ur 0.2 0.2 - 1.0 mg/dL   Nitrite, UA Negative Negative   Microscopic Examination Comment     Comment: Microscopic follows if indicated.   Microscopic Examination See below:     Comment: Microscopic was indicated and was performed.   Urinalysis Reflex Comment     Comment: This specimen will not reflex to a Urine Culture.  Microscopic Examination     Status: None   Collection Time: 05/03/22  4:10 PM   Urine  Result Value Ref Range   WBC, UA None seen 0 - 5 /hpf   RBC, Urine None seen 0 - 2 /hpf   Epithelial Cells (non renal) None seen 0 - 10 /hpf   Casts None seen None seen /lpf   Bacteria, UA None seen None seen/Few        Assessment/Plan: 1. Encounter for general adult medical examination with abnormal findings Cpe performed, due for mammogram  2. Vitamin D deficiency - ergocalciferol (DRISDOL) 1.25 MG (50000 UT) capsule; Take one cap q week  Dispense: 12 capsule; Refill: 3  3. Visit for screening mammogram - MM 3D SCREENING MAMMOGRAM BILATERAL BREAST; Future  4. Vulvar lesion - Ambulatory referral to Obstetrics /  Gynecology  5. Dysuria - UA/M w/rflx Culture, Routine - Microscopic Examination   General Counseling: Kathleen Washington verbalizes understanding of the findings of todays visit and agrees with plan of treatment. I have discussed any further diagnostic evaluation that may be needed or ordered today. We also reviewed her medications today. she has been encouraged to call the office with any questions or concerns that should arise related to todays visit.    Counseling:    Orders Placed This Encounter  Procedures   Microscopic Examination   MM 3D SCREENING MAMMOGRAM BILATERAL BREAST   UA/M w/rflx Culture,  Routine   Ambulatory referral to Obstetrics / Gynecology    Meds ordered this encounter  Medications   ergocalciferol (DRISDOL) 1.25 MG (50000 UT) capsule    Sig: Take one cap q week    Dispense:  12 capsule    Refill:  3    This patient was seen by Lynn ItoLauren Zebediah Beezley, PA-C in collaboration with Dr. Beverely RisenFozia Khan as a part of collaborative care agreement.  Total time spent:35 Minutes  Time spent includes review of chart, medications, test results, and follow up plan with the patient.     Lyndon CodeFozia M Khan, MD  Internal Medicine

## 2022-05-04 LAB — UA/M W/RFLX CULTURE, ROUTINE
Bilirubin, UA: NEGATIVE
Glucose, UA: NEGATIVE
Ketones, UA: NEGATIVE
Leukocytes,UA: NEGATIVE
Nitrite, UA: NEGATIVE
Protein,UA: NEGATIVE
RBC, UA: NEGATIVE
Specific Gravity, UA: 1.005 (ref 1.005–1.030)
Urobilinogen, Ur: 0.2 mg/dL (ref 0.2–1.0)
pH, UA: 5.5 (ref 5.0–7.5)

## 2022-05-04 LAB — MICROSCOPIC EXAMINATION
Bacteria, UA: NONE SEEN
Casts: NONE SEEN /lpf
Epithelial Cells (non renal): NONE SEEN /hpf (ref 0–10)
RBC, Urine: NONE SEEN /hpf (ref 0–2)
WBC, UA: NONE SEEN /hpf (ref 0–5)

## 2022-05-13 ENCOUNTER — Encounter: Payer: Self-pay | Admitting: Obstetrics and Gynecology

## 2022-05-27 ENCOUNTER — Ambulatory Visit
Admission: RE | Admit: 2022-05-27 | Discharge: 2022-05-27 | Disposition: A | Discharge status: Home / Self Care | Payer: BC Managed Care – PPO | Source: Ambulatory Visit | Attending: Physician Assistant | Admitting: Physician Assistant

## 2022-05-27 DIAGNOSIS — Z1231 Encounter for screening mammogram for malignant neoplasm of breast: Secondary | ICD-10-CM | POA: Diagnosis not present

## 2022-07-04 NOTE — Progress Notes (Unsigned)
    McDonough, Salomon Fick, PA-C   No chief complaint on file.   HPI:      Ms. Kathleen Washington is a 57 y.o. No obstetric history on file. whose LMP was Patient's last menstrual period was 04/27/2016., presents today for NP eval of     Neg pap 3/21 SPANISH INTERPRETER NEEDED    Patient Active Problem List   Diagnosis Date Noted   Encounter for general adult medical examination with abnormal findings 05/02/2019   Acute pain of right shoulder 05/02/2019   Chronic pain of both knees 05/02/2019   Other cyst of bone, left ankle and foot 05/02/2019   Routine cervical smear 05/02/2019   Screening for colon cancer 05/07/2017   Vitamin D deficiency 05/07/2017   Dysuria 05/07/2017    Past Surgical History:  Procedure Laterality Date   BREAST BIOPSY Left 05/07/2013   neg   COLONOSCOPY WITH PROPOFOL N/A 12/11/2018   Procedure: COLONOSCOPY WITH PROPOFOL;  Surgeon: Wyline Mood, MD;  Location: [facility] ENDOSCOPY;  Service: Gastroenterology;  Laterality: N/A;    No family history on file.  Social History   Socioeconomic History   Marital status: Divorced    Spouse name: Not on file   Number of children: Not on file   Years of education: Not on file   Highest education level: Not on file  Occupational History   Not on file  Tobacco Use   Smoking status: Never   Smokeless tobacco: Never  Substance and Sexual Activity   Alcohol use: No   Drug use: No   Sexual activity: Not on file  Other Topics Concern   Not on file  Social History Narrative   Not on file   Social Determinants of Health   Financial Resource Strain: Not on file  Food Insecurity: Not on file  Transportation Needs: Not on file  Physical Activity: Not on file  Stress: Not on file  Social Connections: Not on file  Intimate Partner Violence: Not on file    Outpatient Medications Prior to Visit  Medication Sig Dispense Refill   ergocalciferol (DRISDOL) 1.25 MG (50000 UT) capsule Take one cap q week 12  capsule 3   ibuprofen (ADVIL) 600 MG tablet Take 1 tablet (600 mg total) by mouth every 8 (eight) hours as needed. 30 tablet 0   No facility-administered medications prior to visit.      ROS:  Review of Systems BREAST: No symptoms   OBJECTIVE:   Vitals:  LMP 04/27/2016   Physical Exam  Results: No results found for this or any previous visit (from the past 24 hour(s)).   Assessment/Plan: No diagnosis found.    No orders of the defined types were placed in this encounter.     No follow-ups on file.  Havilah Topor B. Dellamae Rosamilia, PA-C 07/04/2022 4:41 PM

## 2022-07-06 ENCOUNTER — Ambulatory Visit (INDEPENDENT_AMBULATORY_CARE_PROVIDER_SITE_OTHER): Payer: BC Managed Care – PPO | Admitting: Obstetrics and Gynecology

## 2022-07-06 ENCOUNTER — Encounter: Payer: Self-pay | Admitting: Obstetrics and Gynecology

## 2022-07-06 VITALS — BP 106/60 | Ht <= 58 in | Wt 130.0 lb

## 2022-07-06 DIAGNOSIS — N952 Postmenopausal atrophic vaginitis: Secondary | ICD-10-CM | POA: Diagnosis not present

## 2022-07-06 DIAGNOSIS — Z01419 Encounter for gynecological examination (general) (routine) without abnormal findings: Secondary | ICD-10-CM

## 2022-07-06 NOTE — Patient Instructions (Signed)
I value your feedback and you entrusting us with your care. If you get a [facility] patient survey, I would appreciate you taking the time to let us know about your experience today. Thank you! ? ? ?

## 2022-12-22 DIAGNOSIS — L811 Chloasma: Secondary | ICD-10-CM | POA: Diagnosis not present

## 2023-03-24 DIAGNOSIS — L811 Chloasma: Secondary | ICD-10-CM | POA: Diagnosis not present

## 2023-05-09 ENCOUNTER — Ambulatory Visit
Admission: RE | Admit: 2023-05-09 | Discharge: 2023-05-09 | Disposition: A | Discharge status: Home / Self Care | Source: Ambulatory Visit | Attending: Physician Assistant

## 2023-05-09 ENCOUNTER — Encounter: Payer: Self-pay | Admitting: Physician Assistant

## 2023-05-09 ENCOUNTER — Ambulatory Visit
Admission: RE | Admit: 2023-05-09 | Discharge: 2023-05-09 | Disposition: A | Discharge status: Home / Self Care | Attending: Physician Assistant | Admitting: Physician Assistant

## 2023-05-09 ENCOUNTER — Ambulatory Visit (INDEPENDENT_AMBULATORY_CARE_PROVIDER_SITE_OTHER): Payer: BC Managed Care – PPO | Admitting: Physician Assistant

## 2023-05-09 VITALS — BP 105/70 | HR 68 | Temp 98.1°F | Resp 16 | Ht <= 58 in | Wt 133.6 lb

## 2023-05-09 DIAGNOSIS — M7989 Other specified soft tissue disorders: Secondary | ICD-10-CM | POA: Insufficient documentation

## 2023-05-09 DIAGNOSIS — M19041 Primary osteoarthritis, right hand: Secondary | ICD-10-CM | POA: Diagnosis not present

## 2023-05-09 DIAGNOSIS — M25541 Pain in joints of right hand: Secondary | ICD-10-CM

## 2023-05-09 DIAGNOSIS — Z1231 Encounter for screening mammogram for malignant neoplasm of breast: Secondary | ICD-10-CM

## 2023-05-09 DIAGNOSIS — Z0001 Encounter for general adult medical examination with abnormal findings: Secondary | ICD-10-CM | POA: Diagnosis not present

## 2023-05-09 NOTE — Progress Notes (Signed)
 [facility] [address] [place], [place] [address]  Internal MEDICINE  Office Visit Note  Patient Name: Kathleen Washington  [id number]  [id number]  Date of Service: 05/26/2023  Chief Complaint  Patient presents with   Annual Exam   Hand Pain     HPI Pt is here for routine health maintenance examination -will think about shingles vaccines -Right index finger painful and swollen at distal joint -due for mammogram, UTD on colonoscopy and pap -will check labs including rheumatoid/inflammatory  Current Medication: Outpatient Encounter Medications as of 05/09/2023  Medication Sig   ergocalciferol (DRISDOL) 1.25 MG (50000 UT) capsule Take one cap q week   ibuprofen (ADVIL) 600 MG tablet Take 1 tablet (600 mg total) by mouth every 8 (eight) hours as needed.   No facility-administered encounter medications on file as of 05/09/2023.    Surgical History: Past Surgical History:  Procedure Laterality Date   BREAST BIOPSY Left 05/07/2013   neg   COLONOSCOPY WITH PROPOFOL N/A 12/11/2018   Procedure: COLONOSCOPY WITH PROPOFOL;  Surgeon: Luke Salaam, MD;  Location: [facility] ENDOSCOPY;  Service: Gastroenterology;  Laterality: N/A;    Medical History: Past Medical History:  Diagnosis Date   Low vitamin D level     Family History: History reviewed. No pertinent family history.    Review of Systems  Constitutional:  Negative for chills and unexpected weight change.  HENT:  Negative for congestion, postnasal drip, rhinorrhea, sneezing and sore throat.   Eyes:  Negative for redness.  Respiratory:  Negative for cough, chest tightness and shortness of breath.   Cardiovascular:  Negative for chest pain and palpitations.  Gastrointestinal:  Negative for abdominal pain, constipation, diarrhea, nausea and vomiting.  Genitourinary:  Negative for dysuria and frequency.  Musculoskeletal:  Positive for arthralgias and joint swelling. Negative for back pain and neck pain.   Skin:  Negative for rash.  Neurological: Negative.  Negative for tremors and numbness.  Hematological:  Negative for adenopathy. Does not bruise/bleed easily.  Psychiatric/Behavioral:  Negative for behavioral problems (Depression) and suicidal ideas. The patient is not nervous/anxious.      Vital Signs: BP 105/70   Pulse 68   Temp 98.1 F (36.7 C)   Resp 16   Ht 4\' 8"  (1.422 m)   Wt 133 lb 9.6 oz (60.6 kg)   LMP 04/27/2016   SpO2 97%   BMI 29.95 kg/m    Physical Exam Vitals and nursing note reviewed.  Constitutional:      General: She is not in acute distress.    Appearance: Normal appearance. She is well-developed. She is not diaphoretic.  HENT:     Head: Normocephalic and atraumatic.     Mouth/Throat:     Pharynx: No oropharyngeal exudate.  Eyes:     Pupils: Pupils are equal, round, and reactive to light.  Neck:     Thyroid: No thyromegaly.     Vascular: No JVD.     Trachea: No tracheal deviation.  Cardiovascular:     Rate and Rhythm: Normal rate and regular rhythm.     Heart sounds: Normal heart sounds. No murmur heard.    No friction rub. No gallop.  Pulmonary:     Effort: Pulmonary effort is normal. No respiratory distress.     Breath sounds: No wheezing or rales.  Chest:     Chest wall: No tenderness.  Breasts:    Right: Normal. No mass.     Left: Normal. No mass.  Abdominal:  General: Bowel sounds are normal.     Palpations: Abdomen is soft.  Musculoskeletal:        General: Normal range of motion.     Cervical back: Normal range of motion and neck supple.  Lymphadenopathy:     Cervical: No cervical adenopathy.  Skin:    General: Skin is warm and dry.  Neurological:     Mental Status: She is alert and oriented to person, place, and time.  Psychiatric:        Behavior: Behavior normal.        Thought Content: Thought content normal.        Judgment: Judgment normal.      LABS: Recent Results (from the past 2160 hours)  CBC with  Differential/Platelet     Status: None   Collection Time: 05/13/23 10:59 AM  Result Value Ref Range   WBC 4.5 3.4 - 10.8 x10E3/uL   RBC 4.30 3.77 - 5.28 x10E6/uL   Hemoglobin 13.3 11.1 - 15.9 g/dL   Hematocrit 91.4 78.2 - 46.6 %   MCV 94 79 - 97 fL   MCH 30.9 26.6 - 33.0 pg   MCHC 32.8 31.5 - 35.7 g/dL   RDW 95.6 21.3 - 08.6 %   Platelets 241 150 - 450 x10E3/uL   Neutrophils 51 Not Estab. %   Lymphs 39 Not Estab. %   Monocytes 7 Not Estab. %   Eos 2 Not Estab. %   Basos 1 Not Estab. %   Neutrophils Absolute 2.3 1.4 - 7.0 x10E3/uL   Lymphocytes Absolute 1.8 0.7 - 3.1 x10E3/uL   Monocytes Absolute 0.3 0.1 - 0.9 x10E3/uL   EOS (ABSOLUTE) 0.1 0.0 - 0.4 x10E3/uL   Basophils Absolute 0.0 0.0 - 0.2 x10E3/uL   Immature Granulocytes 0 Not Estab. %   Immature Grans (Abs) 0.0 0.0 - 0.1 x10E3/uL  Comprehensive metabolic panel with GFR     Status: Abnormal   Collection Time: 05/13/23 10:59 AM  Result Value Ref Range   Glucose 91 70 - 99 mg/dL   BUN 19 6 - 24 mg/dL   Creatinine, Ser 5.78 0.57 - 1.00 mg/dL   eGFR 469 >62 XB/MWU/1.32   BUN/Creatinine Ratio 27 (H) 9 - 23   Sodium 141 134 - 144 mmol/L   Potassium 4.2 3.5 - 5.2 mmol/L   Chloride 104 96 - 106 mmol/L   CO2 21 20 - 29 mmol/L   Calcium 9.7 8.7 - 10.2 mg/dL   Total Protein 7.0 6.0 - 8.5 g/dL   Albumin 4.5 3.8 - 4.9 g/dL   Globulin, Total 2.5 1.5 - 4.5 g/dL   Bilirubin Total 0.5 0.0 - 1.2 mg/dL   Alkaline Phosphatase 108 44 - 121 IU/L   AST 28 0 - 40 IU/L   ALT 29 0 - 32 IU/L  Lipid Panel With LDL/HDL Ratio     Status: Abnormal   Collection Time: 05/13/23 10:59 AM  Result Value Ref Range   Cholesterol, Total 209 (H) 100 - 199 mg/dL   Triglycerides 42 0 - 149 mg/dL   HDL 80 >44 mg/dL   VLDL Cholesterol Cal 8 5 - 40 mg/dL   LDL Chol Calc (NIH) 010 (H) 0 - 99 mg/dL   LDL/HDL Ratio 1.5 0.0 - 3.2 ratio    Comment:  LDL/HDL Ratio                                             Men  Women                                1/2 Avg.Risk  1.0    1.5                                   Avg.Risk  3.6    3.2                                2X Avg.Risk  6.2    5.0                                3X Avg.Risk  8.0    6.1   B12 and Folate Panel     Status: None   Collection Time: 05/13/23 10:59 AM  Result Value Ref Range   Vitamin B-12 531 232 - 1,245 pg/mL   Folate 14.9 >3.0 ng/mL    Comment: A serum folate concentration of less than 3.1 ng/mL is considered to represent clinical deficiency.   T4, free     Status: None   Collection Time: 05/13/23 10:59 AM  Result Value Ref Range   Free T4 0.93 0.82 - 1.77 ng/dL  TSH     Status: None   Collection Time: 05/13/23 10:59 AM  Result Value Ref Range   TSH 0.830 0.450 - 4.500 uIU/mL  Rheumatoid factor     Status: None   Collection Time: 05/13/23 10:59 AM  Result Value Ref Range   Rheumatoid fact SerPl-aCnc <10.0 <14.0 IU/mL  VITAMIN D 25 Hydroxy (Vit-D Deficiency, Fractures)     Status: Abnormal   Collection Time: 05/13/23 10:59 AM  Result Value Ref Range   Vit D, 25-Hydroxy 27.0 (L) 30.0 - 100.0 ng/mL    Comment: Vitamin D deficiency has been defined by the Institute of Medicine and an Endocrine Society practice guideline as a level of serum 25-OH vitamin D less than 20 ng/mL (1,2). The Endocrine Society went on to further define vitamin D insufficiency as a level between 21 and 29 ng/mL (2). 1. IOM (Institute of Medicine). 2010. Dietary reference    intakes for calcium and D. [place]: The    [employer]. 2. Holick MF, Binkley [place], Bischoff-Ferrari HA, et al.    Evaluation, treatment, and prevention of vitamin D    deficiency: an Endocrine Society clinical practice    guideline. JCEM. 2011 Jul; 96(7):1911-30.   Uric acid     Status: None   Collection Time: 05/13/23 10:59 AM  Result Value Ref Range   Uric Acid 4.8 3.0 - 7.2 mg/dL    Comment:            Therapeutic target for gout patients: <6.0         Assessment/Plan: 1. Encounter for general adult medical examination with abnormal findings (Primary) CPE performed, lab slip given, UTD on colon screening and pap. Due for mammogram  2. Arthralgia of right hand Will check xray  and inflammatory labs  3. Swelling of right index finger Will check xray and labs - DG Finger Index Right; Future  4. Visit for screening mammogram - MM 3D SCREENING MAMMOGRAM BILATERAL BREAST; Future   General Counseling: Jacqui verbalizes understanding of the findings of todays visit and agrees with plan of treatment. I have discussed any further diagnostic evaluation that may be needed or ordered today. We also reviewed her medications today. she has been encouraged to call the office with any questions or concerns that should arise related to todays visit.    Counseling:    Orders Placed This Encounter  Procedures   MM 3D SCREENING MAMMOGRAM BILATERAL BREAST   DG Finger Index Right    No orders of the defined types were placed in this encounter.   This patient was seen by Taylor Favia, PA-C in collaboration with Dr. Verneta Gone as a part of collaborative care agreement.  Total time spent:35 Minutes  Time spent includes review of chart, medications, test results, and follow up plan with the patient.     Lawton Price, MD  Internal Medicine

## 2023-05-12 ENCOUNTER — Telehealth: Payer: Self-pay

## 2023-05-12 NOTE — Telephone Encounter (Signed)
 Lmom to call us back

## 2023-05-13 ENCOUNTER — Other Ambulatory Visit: Payer: Self-pay | Admitting: Physician Assistant

## 2023-05-13 ENCOUNTER — Telehealth: Payer: Self-pay

## 2023-05-13 DIAGNOSIS — E538 Deficiency of other specified B group vitamins: Secondary | ICD-10-CM | POA: Diagnosis not present

## 2023-05-13 DIAGNOSIS — Z1329 Encounter for screening for other suspected endocrine disorder: Secondary | ICD-10-CM | POA: Diagnosis not present

## 2023-05-13 DIAGNOSIS — E559 Vitamin D deficiency, unspecified: Secondary | ICD-10-CM | POA: Diagnosis not present

## 2023-05-13 DIAGNOSIS — E782 Mixed hyperlipidemia: Secondary | ICD-10-CM | POA: Diagnosis not present

## 2023-05-13 NOTE — Telephone Encounter (Signed)
-----   Message from Carlean Jews sent at 05/12/2023 12:35 PM EDT ----- Please let her know that her xray showed degenerative changes (arthritis) in her finger, nothing acute

## 2023-05-13 NOTE — Telephone Encounter (Signed)
 Pt advised that  xray showed degenerative changes (arthritis) in her finger, nothing acute

## 2023-05-14 LAB — VITAMIN D 25 HYDROXY (VIT D DEFICIENCY, FRACTURES): Vit D, 25-Hydroxy: 27 ng/mL — ABNORMAL LOW (ref 30.0–100.0)

## 2023-05-14 LAB — COMPREHENSIVE METABOLIC PANEL WITH GFR
ALT: 29 IU/L (ref 0–32)
AST: 28 IU/L (ref 0–40)
Albumin: 4.5 g/dL (ref 3.8–4.9)
Alkaline Phosphatase: 108 IU/L (ref 44–121)
BUN/Creatinine Ratio: 27 — ABNORMAL HIGH (ref 9–23)
BUN: 19 mg/dL (ref 6–24)
Bilirubin Total: 0.5 mg/dL (ref 0.0–1.2)
CO2: 21 mmol/L (ref 20–29)
Calcium: 9.7 mg/dL (ref 8.7–10.2)
Chloride: 104 mmol/L (ref 96–106)
Creatinine, Ser: 0.7 mg/dL (ref 0.57–1.00)
Globulin, Total: 2.5 g/dL (ref 1.5–4.5)
Glucose: 91 mg/dL (ref 70–99)
Potassium: 4.2 mmol/L (ref 3.5–5.2)
Sodium: 141 mmol/L (ref 134–144)
Total Protein: 7 g/dL (ref 6.0–8.5)
eGFR: 100 mL/min/{1.73_m2} (ref >59)

## 2023-05-14 LAB — LIPID PANEL WITH LDL/HDL RATIO
Cholesterol, Total: 209 mg/dL — ABNORMAL HIGH (ref 100–199)
HDL: 80 mg/dL (ref >39)
LDL Chol Calc (NIH): 121 mg/dL — ABNORMAL HIGH (ref 0–99)
LDL/HDL Ratio: 1.5 ratio (ref 0.0–3.2)
Triglycerides: 42 mg/dL (ref 0–149)
VLDL Cholesterol Cal: 8 mg/dL (ref 5–40)

## 2023-05-14 LAB — CBC WITH DIFFERENTIAL/PLATELET
Basophils Absolute: 0 10*3/uL (ref 0.0–0.2)
Basos: 1 %
EOS (ABSOLUTE): 0.1 10*3/uL (ref 0.0–0.4)
Eos: 2 %
Hematocrit: 40.6 % (ref 34.0–46.6)
Hemoglobin: 13.3 g/dL (ref 11.1–15.9)
Immature Grans (Abs): 0 10*3/uL (ref 0.0–0.1)
Immature Granulocytes: 0 %
Lymphocytes Absolute: 1.8 10*3/uL (ref 0.7–3.1)
Lymphs: 39 %
MCH: 30.9 pg (ref 26.6–33.0)
MCHC: 32.8 g/dL (ref 31.5–35.7)
MCV: 94 fL (ref 79–97)
Monocytes Absolute: 0.3 10*3/uL (ref 0.1–0.9)
Monocytes: 7 %
Neutrophils Absolute: 2.3 10*3/uL (ref 1.4–7.0)
Neutrophils: 51 %
Platelets: 241 10*3/uL (ref 150–450)
RBC: 4.3 x10E6/uL (ref 3.77–5.28)
RDW: 12.2 % (ref 11.7–15.4)
WBC: 4.5 10*3/uL (ref 3.4–10.8)

## 2023-05-14 LAB — TSH: TSH: 0.83 u[IU]/mL (ref 0.450–4.500)

## 2023-05-14 LAB — URIC ACID: Uric Acid: 4.8 mg/dL (ref 3.0–7.2)

## 2023-05-14 LAB — RHEUMATOID FACTOR: Rheumatoid fact SerPl-aCnc: <10 [IU]/mL (ref <14.0)

## 2023-05-14 LAB — B12 AND FOLATE PANEL
Folate: 14.9 ng/mL (ref >3.0)
Vitamin B-12: 531 pg/mL (ref 232–1245)

## 2023-05-14 LAB — T4, FREE: Free T4: 0.93 ng/dL (ref 0.82–1.77)

## 2023-05-30 ENCOUNTER — Ambulatory Visit: Admitting: Physician Assistant

## 2023-05-30 ENCOUNTER — Encounter: Payer: Self-pay | Admitting: Physician Assistant

## 2023-05-30 VITALS — BP 115/70 | HR 82 | Temp 97.8°F | Resp 16 | Ht <= 58 in | Wt 133.8 lb

## 2023-05-30 DIAGNOSIS — E559 Vitamin D deficiency, unspecified: Secondary | ICD-10-CM | POA: Diagnosis not present

## 2023-05-30 DIAGNOSIS — E782 Mixed hyperlipidemia: Secondary | ICD-10-CM | POA: Diagnosis not present

## 2023-05-30 DIAGNOSIS — R490 Dysphonia: Secondary | ICD-10-CM | POA: Diagnosis not present

## 2023-05-30 NOTE — Progress Notes (Signed)
 [facility] [address] [place], [place] [address]  Internal MEDICINE  Office Visit Note  Patient Name: Kathleen Washington  [id number]  [id number]  Date of Service: 06/12/2023  Chief Complaint  Patient presents with   Follow-up    Review Labs    HPI Pt is here for lab review -will have mammogram on wed -labs reviewed-LDL elevated--will try to limit fried foods and try fish oil -Vit D low and will start OTC supplement -hoarseness for last 1-2 weeks and thinks it is related to the weather/pollen, otherwise feeling well  Current Medication: Outpatient Encounter Medications as of 05/30/2023  Medication Sig   ibuprofen  (ADVIL ) 600 MG tablet Take 1 tablet (600 mg total) by mouth every 8 (eight) hours as needed.   [DISCONTINUED] ergocalciferol  (DRISDOL ) 1.25 MG (50000 UT) capsule Take one cap q week   No facility-administered encounter medications on file as of 05/30/2023.    Surgical History: Past Surgical History:  Procedure Laterality Date   BREAST BIOPSY Left 05/07/2013   neg   COLONOSCOPY WITH PROPOFOL  N/A 12/11/2018   Procedure: COLONOSCOPY WITH PROPOFOL ;  Surgeon: Luke Salaam, MD;  Location: [facility] ENDOSCOPY;  Service: Gastroenterology;  Laterality: N/A;    Medical History: Past Medical History:  Diagnosis Date   Low vitamin D  level     Family History: Family History  Problem Relation Age of Onset   Breast cancer Neg Hx     Social History   Socioeconomic History   Marital status: Divorced    Spouse name: Not on file   Number of children: Not on file   Years of education: Not on file   Highest education level: Not on file  Occupational History   Not on file  Tobacco Use   Smoking status: Never   Smokeless tobacco: Never  Vaping Use   Vaping status: Never Used  Substance and Sexual Activity   Alcohol use: No   Drug use: No   Sexual activity: Not Currently    Birth control/protection: Post-menopausal  Other Topics Concern   Not on file   Social History Narrative   Not on file   Social Drivers of Health   Financial Resource Strain: Not on file  Food Insecurity: Not on file  Transportation Needs: Not on file  Physical Activity: Not on file  Stress: Not on file  Social Connections: Not on file  Intimate Partner Violence: Not on file      Review of Systems  Constitutional:  Negative for chills and unexpected weight change.  HENT:  Positive for voice change. Negative for congestion, rhinorrhea, sneezing and sore throat.   Eyes:  Negative for redness.  Respiratory:  Negative for cough, chest tightness and shortness of breath.   Cardiovascular:  Negative for chest pain and palpitations.  Gastrointestinal:  Negative for abdominal pain, constipation, diarrhea, nausea and vomiting.  Genitourinary:  Negative for dysuria and frequency.  Musculoskeletal:  Positive for arthralgias and joint swelling. Negative for back pain and neck pain.  Skin:  Negative for rash.  Neurological: Negative.  Negative for tremors and numbness.  Hematological:  Negative for adenopathy. Does not bruise/bleed easily.  Psychiatric/Behavioral:  Negative for behavioral problems (Depression) and suicidal ideas. The patient is not nervous/anxious.     Vital Signs: BP 115/70   Pulse 82   Temp 97.8 F (36.6 C)   Resp 16   Ht 4\' 8"  (1.422 m)   Wt 133 lb 12.8 oz (60.7 kg)   LMP 04/27/2016  SpO2 99%   BMI 30.00 kg/m    Physical Exam Vitals and nursing note reviewed.  Constitutional:      General: She is not in acute distress.    Appearance: Normal appearance. She is well-developed. She is not diaphoretic.  HENT:     Head: Normocephalic and atraumatic.  Eyes:     Extraocular Movements: Extraocular movements intact.  Neck:     Thyroid : No thyromegaly.     Vascular: No JVD.     Trachea: No tracheal deviation.  Cardiovascular:     Rate and Rhythm: Normal rate and regular rhythm.     Heart sounds: Normal heart sounds. No murmur heard.     No friction rub. No gallop.  Pulmonary:     Effort: Pulmonary effort is normal. No respiratory distress.     Breath sounds: No wheezing or rales.  Chest:  Breasts:    Right: Normal. No mass.     Left: Normal. No mass.  Skin:    General: Skin is warm and dry.  Neurological:     Mental Status: She is alert and oriented to person, place, and time.  Psychiatric:        Behavior: Behavior normal.        Thought Content: Thought content normal.        Judgment: Judgment normal.        Assessment/Plan: 1. Hoarseness of voice (Primary) Likely secondary to pollen and discussed possible antihistamine and trying warm tea with honey and resting voice. Advised to call if not improving.  2. Vitamin D  deficiency Will start 1000 units daily  3. Mixed hyperlipidemia Will work on diet and exercise and start on fish oil   General Counseling: Kathleen Washington verbalizes understanding of the findings of todays visit and agrees with plan of treatment. I have discussed any further diagnostic evaluation that may be needed or ordered today. We also reviewed her medications today. she has been encouraged to call the office with any questions or concerns that should arise related to todays visit.    No orders of the defined types were placed in this encounter.   No orders of the defined types were placed in this encounter.   This patient was seen by Taylor Favia, PA-C in collaboration with Dr. Verneta Gone as a part of collaborative care agreement.   Total time spent:25 Minutes Time spent includes review of chart, medications, test results, and follow up plan with the patient.      Dr Fozia M Khan Internal medicine

## 2023-06-01 ENCOUNTER — Ambulatory Visit
Admission: RE | Admit: 2023-06-01 | Discharge: 2023-06-01 | Disposition: A | Discharge status: Home / Self Care | Source: Ambulatory Visit | Attending: Physician Assistant | Admitting: Physician Assistant

## 2023-06-01 DIAGNOSIS — Z1231 Encounter for screening mammogram for malignant neoplasm of breast: Secondary | ICD-10-CM | POA: Insufficient documentation

## 2023-09-22 DIAGNOSIS — L811 Chloasma: Secondary | ICD-10-CM | POA: Diagnosis not present

## 2024-05-10 ENCOUNTER — Encounter: Admitting: Physician Assistant
